# Patient Record
Sex: Male | Born: 1975 | Race: Black or African American | Hispanic: No | Marital: Single | Smoking: Current every day smoker
Health system: Southern US, Community
[De-identification: ages and names within clinical notes are randomized; demographics above are authoritative.]

## PROBLEM LIST (undated history)

## (undated) DIAGNOSIS — J45909 Unspecified asthma, uncomplicated: Secondary | ICD-10-CM

## (undated) DIAGNOSIS — T7840XA Allergy, unspecified, initial encounter: Secondary | ICD-10-CM

## (undated) HISTORY — DX: Allergy, unspecified, initial encounter: T78.40XA

## (undated) HISTORY — PX: SHOULDER SURGERY: SHX246

## (undated) HISTORY — PX: KNEE SURGERY: SHX244

---

## 1998-08-18 ENCOUNTER — Emergency Department (HOSPITAL_COMMUNITY): Admission: EM | Admit: 1998-08-18 | Discharge: 1998-08-18 | Payer: Self-pay | Admitting: Emergency Medicine

## 1998-08-18 ENCOUNTER — Encounter: Payer: Self-pay | Admitting: Emergency Medicine

## 1999-11-14 ENCOUNTER — Encounter: Payer: Self-pay | Admitting: Emergency Medicine

## 1999-11-14 ENCOUNTER — Emergency Department (HOSPITAL_COMMUNITY): Admission: EM | Admit: 1999-11-14 | Discharge: 1999-11-14 | Payer: Self-pay | Admitting: Emergency Medicine

## 2005-12-04 ENCOUNTER — Emergency Department (HOSPITAL_COMMUNITY): Admission: EM | Admit: 2005-12-04 | Discharge: 2005-12-04 | Payer: Self-pay | Admitting: Emergency Medicine

## 2008-04-12 ENCOUNTER — Inpatient Hospital Stay (HOSPITAL_COMMUNITY): Admission: EM | Admit: 2008-04-12 | Discharge: 2008-04-13 | Payer: Self-pay | Admitting: Emergency Medicine

## 2010-05-26 LAB — LIPID PANEL
Cholesterol: 147 mg/dL (ref 0–200)
HDL: 39 mg/dL — ABNORMAL LOW (ref >39)
LDL Cholesterol: 91 mg/dL (ref 0–99)
Total CHOL/HDL Ratio: 3.8 RATIO
Triglycerides: 83 mg/dL (ref <150)
VLDL: 17 mg/dL (ref 0–40)

## 2010-05-26 LAB — CARDIAC PANEL(CRET KIN+CKTOT+MB+TROPI)
CK, MB: 2 ng/mL (ref 0.3–4.0)
CK, MB: 2.3 ng/mL (ref 0.3–4.0)
Relative Index: 0.6 (ref 0.0–2.5)
Relative Index: 0.6 (ref 0.0–2.5)
Total CK: 360 U/L — ABNORMAL HIGH (ref 7–232)
Total CK: 408 U/L — ABNORMAL HIGH (ref 7–232)
Troponin I: <0.01 ng/mL (ref 0.00–0.06)

## 2010-05-26 LAB — CBC
HCT: 38.2 % — ABNORMAL LOW (ref 39.0–52.0)
Hemoglobin: 11.8 g/dL — ABNORMAL LOW (ref 13.0–17.0)
RBC: 5.03 MIL/uL (ref 4.22–5.81)
RDW: 15.6 % — ABNORMAL HIGH (ref 11.5–15.5)

## 2010-05-31 LAB — CBC
MCHC: 31.1 g/dL (ref 30.0–36.0)
MCV: 75.3 fL — ABNORMAL LOW (ref 78.0–100.0)
Platelets: 269 10*3/uL (ref 150–400)
RDW: 15.6 % — ABNORMAL HIGH (ref 11.5–15.5)

## 2010-05-31 LAB — BASIC METABOLIC PANEL
BUN: 7 mg/dL (ref 6–23)
CO2: 27 mEq/L (ref 19–32)
Calcium: 9.1 mg/dL (ref 8.4–10.5)
Chloride: 103 mEq/L (ref 96–112)
Creatinine, Ser: 1.19 mg/dL (ref 0.4–1.5)
GFR calc Af Amer: >60 mL/min (ref >60)
Glucose, Bld: 101 mg/dL — ABNORMAL HIGH (ref 70–99)

## 2010-05-31 LAB — CARDIAC PANEL(CRET KIN+CKTOT+MB+TROPI)
CK, MB: 2.8 ng/mL (ref 0.3–4.0)
Total CK: 474 U/L — ABNORMAL HIGH (ref 7–232)
Troponin I: 0.01 ng/mL (ref 0.00–0.06)

## 2010-05-31 LAB — PROTIME-INR
INR: 1.2 (ref 0.00–1.49)
Prothrombin Time: 15.4 seconds — ABNORMAL HIGH (ref 11.6–15.2)

## 2010-05-31 LAB — POCT CARDIAC MARKERS: Myoglobin, poc: 135 ng/mL (ref 12–200)

## 2010-05-31 LAB — TSH: TSH: 0.891 u[IU]/mL (ref 0.350–4.500)

## 2010-05-31 LAB — HEPARIN LEVEL (UNFRACTIONATED): Heparin Unfractionated: 0.51 IU/mL (ref 0.30–0.70)

## 2010-07-01 NOTE — Discharge Summary (Signed)
Tanner Holden, Tanner Holden          ACCOUNT NO.:  [id number]   MEDICAL RECORD NO.:  [id number]          PATIENT TYPE:  INP   LOCATION:  1431                         FACILITY:  [facility]   PHYSICIAN:  Ricki Rodriguez, M.D.  DATE OF BIRTH:  01-21-1976   DATE OF ADMISSION:  04/12/2008  DATE OF DISCHARGE:  04/13/2008                               DISCHARGE SUMMARY   FINAL DIAGNOSES:  1. Precordial chest pain.  2. Tobacco use disorder.  3. Asthma.  4. Tachycardia.   PRINCIPAL PROCEDURES:  The patient had left heart catheterization,  selective coronary angiography, left renal function study done by Dr.  Orpah Cobb on April 13, 2008.   DISCHARGE MEDICATIONS:  1. Prilosec 20 mg 1 daily.  2. Metoprolol 50 mg 1/2 twice daily.   DISCHARGE DIET:  Low-sodium, heart-healthy diet.   WOUND CARE INSTRUCTIONS:  The patient to notify right groin pain,  swelling or discharge.   FOLLOWUP:  By Dr. Orpah Cobb in 1 week.  The patient to call [phone]  for appointment.   DISCHARGE ACTIVITY:  The patient to increase activity slowly after 2  days of sedentary lifestyle.   HISTORY:  This 35 year old black male presented with knot type of lower  sternal chest pain increased with deep breathing with negative risk  factors for diabetes and hypertension.  Positive history of smoking and  alcohol intake and negative history of exercise.   PHYSICAL EXAMINATION:  Temperature 98.8, pulse 88, respirations 16,  blood pressure of 117/69.  GENERAL:  The patient is a well-built, well-nourished black male in no  distress.  HEENT:  The patient is normocephalic, atraumatic.  Has brown eyes with  pupil equally reacting to light.  Extraocular movement intact.  NECK:  No JVD.  LUNGS:  Mild expiratory wheeze, chest wall nontender on palpation.  HEART:  Normal S1, S2.  ABDOMEN:  Soft and nontender.  EXTREMITIES:  No edema, cyanosis, clubbing.  SKIN:  Warm and dry.  NEUROLOGICAL:  Cranial nerves grossly intact.  The  patient moves all 4  extremities.   LABORATORY DATA:  Normal hemoglobin, hematocrit, WBC count, platelet  count, normal electrolytes, BUN, creatinine.   EKG:  Sinus rhythm with anterolateral wall ischemia and left lower  hypertrophy.   Chest x-ray:  No acute process.  CK-MB, troponin I negative for  myocardial infarction.   Cardiac catheterization showed normal coronaries and normal LV systolic  function.   HOSPITAL COURSE:  The patient was admitted to telemetry unit.  Myocardial infarction was ruled out.  He underwent cardiac  catheterization that failed to show coronary artery disease.  Hence, he  was discharged home on metoprolol low dose and Prilosec 20 mg 1 daily  with follow-up by me in 2-4 weeks.      Ricki Rodriguez, M.D.  Electronically Signed     ASK/MEDQ  D:  05/14/2008  T:  05/14/2008  Job:  [id number]

## 2010-07-19 ENCOUNTER — Emergency Department (HOSPITAL_COMMUNITY)
Admission: EM | Admit: 2010-07-19 | Discharge: 2010-07-19 | Disposition: A | Discharge status: Home / Self Care | Payer: Self-pay | Attending: Emergency Medicine | Admitting: Emergency Medicine

## 2010-07-19 DIAGNOSIS — J45909 Unspecified asthma, uncomplicated: Secondary | ICD-10-CM | POA: Insufficient documentation

## 2010-07-19 DIAGNOSIS — R109 Unspecified abdominal pain: Secondary | ICD-10-CM | POA: Insufficient documentation

## 2010-07-19 DIAGNOSIS — R197 Diarrhea, unspecified: Secondary | ICD-10-CM | POA: Insufficient documentation

## 2010-07-19 LAB — DIFFERENTIAL
Basophils Absolute: 0 10*3/uL (ref 0.0–0.1)
Lymphs Abs: 1.8 10*3/uL (ref 0.7–4.0)
Monocytes Absolute: 0.6 10*3/uL (ref 0.1–1.0)
Monocytes Relative: 9 % (ref 3–12)
Neutro Abs: 4.4 10*3/uL (ref 1.7–7.7)

## 2010-07-19 LAB — LIPASE, BLOOD: Lipase: 19 U/L (ref 11–59)

## 2010-07-19 LAB — COMPREHENSIVE METABOLIC PANEL
Alkaline Phosphatase: 80 U/L (ref 39–117)
BUN: 14 mg/dL (ref 6–23)
Chloride: 97 mEq/L (ref 96–112)
Creatinine, Ser: 1.3 mg/dL (ref 0.4–1.5)
GFR calc non Af Amer: >60 mL/min (ref >60)
Glucose, Bld: 94 mg/dL (ref 70–99)
Potassium: 4.2 mEq/L (ref 3.5–5.1)
Total Bilirubin: 0.3 mg/dL (ref 0.3–1.2)

## 2010-07-19 LAB — URINALYSIS, ROUTINE W REFLEX MICROSCOPIC
Ketones, ur: NEGATIVE mg/dL
Nitrite: NEGATIVE
Protein, ur: NEGATIVE mg/dL
Urobilinogen, UA: 0.2 mg/dL (ref 0.0–1.0)

## 2010-07-19 LAB — CBC
HCT: 41.1 % (ref 39.0–52.0)
MCH: 23.3 pg — ABNORMAL LOW (ref 26.0–34.0)
MCV: 71.6 fL — ABNORMAL LOW (ref 78.0–100.0)
Platelets: 272 10*3/uL (ref 150–400)
RBC: 5.74 MIL/uL (ref 4.22–5.81)
WBC: 7.1 10*3/uL (ref 4.0–10.5)

## 2012-01-16 ENCOUNTER — Other Ambulatory Visit: Payer: Self-pay | Admitting: Family Medicine

## 2012-01-16 DIAGNOSIS — M25562 Pain in left knee: Secondary | ICD-10-CM

## 2012-01-20 ENCOUNTER — Ambulatory Visit
Admission: RE | Admit: 2012-01-20 | Discharge: 2012-01-20 | Disposition: A | Discharge status: Home / Self Care | Payer: BC Managed Care – PPO | Source: Ambulatory Visit | Attending: Family Medicine | Admitting: Family Medicine

## 2012-01-20 DIAGNOSIS — M25562 Pain in left knee: Secondary | ICD-10-CM

## 2012-06-12 ENCOUNTER — Emergency Department (HOSPITAL_COMMUNITY): Payer: Managed Care, Other (non HMO)

## 2012-06-12 ENCOUNTER — Emergency Department (HOSPITAL_COMMUNITY)
Admission: EM | Admit: 2012-06-12 | Discharge: 2012-06-13 | Disposition: A | Discharge status: Home / Self Care | Payer: Managed Care, Other (non HMO) | Attending: Emergency Medicine | Admitting: Emergency Medicine

## 2012-06-12 ENCOUNTER — Encounter (HOSPITAL_COMMUNITY): Payer: Self-pay | Admitting: *Deleted

## 2012-06-12 DIAGNOSIS — R61 Generalized hyperhidrosis: Secondary | ICD-10-CM | POA: Insufficient documentation

## 2012-06-12 DIAGNOSIS — J159 Unspecified bacterial pneumonia: Secondary | ICD-10-CM | POA: Insufficient documentation

## 2012-06-12 DIAGNOSIS — R222 Localized swelling, mass and lump, trunk: Secondary | ICD-10-CM | POA: Insufficient documentation

## 2012-06-12 DIAGNOSIS — F17200 Nicotine dependence, unspecified, uncomplicated: Secondary | ICD-10-CM | POA: Insufficient documentation

## 2012-06-12 DIAGNOSIS — J189 Pneumonia, unspecified organism: Secondary | ICD-10-CM

## 2012-06-12 DIAGNOSIS — J45909 Unspecified asthma, uncomplicated: Secondary | ICD-10-CM | POA: Insufficient documentation

## 2012-06-12 DIAGNOSIS — R918 Other nonspecific abnormal finding of lung field: Secondary | ICD-10-CM

## 2012-06-12 DIAGNOSIS — Z880 Allergy status to penicillin: Secondary | ICD-10-CM | POA: Insufficient documentation

## 2012-06-12 HISTORY — DX: Unspecified asthma, uncomplicated: J45.909

## 2012-06-12 LAB — CBC WITH DIFFERENTIAL/PLATELET
Basophils Absolute: 0.1 10*3/uL (ref 0.0–0.1)
Eosinophils Absolute: 0.3 10*3/uL (ref 0.0–0.7)
HCT: 37 % — ABNORMAL LOW (ref 39.0–52.0)
Lymphocytes Relative: 22 % (ref 12–46)
MCHC: 32.4 g/dL (ref 30.0–36.0)
Neutro Abs: 4.1 10*3/uL (ref 1.7–7.7)
Neutrophils Relative %: 61 % (ref 43–77)
Platelets: 283 10*3/uL (ref 150–400)
RDW: 14 % (ref 11.5–15.5)
WBC: 6.8 10*3/uL (ref 4.0–10.5)

## 2012-06-12 LAB — COMPREHENSIVE METABOLIC PANEL
AST: 38 U/L — ABNORMAL HIGH (ref 0–37)
Albumin: 3.6 g/dL (ref 3.5–5.2)
Alkaline Phosphatase: 69 U/L (ref 39–117)
BUN: 12 mg/dL (ref 6–23)
Potassium: 4.5 mEq/L (ref 3.5–5.1)
Total Protein: 7.4 g/dL (ref 6.0–8.3)

## 2012-06-12 MED ORDER — LEVOFLOXACIN 500 MG PO TABS: 500.0000 mg | ORAL_TABLET | Freq: Every day | ORAL | Status: DC

## 2012-06-12 MED ORDER — HYDROCODONE-ACETAMINOPHEN 5-325 MG PO TABS: 1.0000 | ORAL_TABLET | ORAL | Status: DC | PRN

## 2012-06-12 MED ORDER — IOHEXOL 350 MG/ML SOLN
100.0000 mL | Freq: Once | INTRAVENOUS | Status: AC | PRN
  Administered 2012-06-12: 100 mL via INTRAVENOUS

## 2012-06-12 MED ORDER — KETOROLAC TROMETHAMINE 30 MG/ML IJ SOLN
30.0000 mg | Freq: Once | INTRAMUSCULAR | Status: AC
  Administered 2012-06-12: 30 mg via INTRAVENOUS
  Filled 2012-06-12: qty 1

## 2012-06-12 NOTE — ED Provider Notes (Signed)
History     CSN: [id number]  Arrival date & time 06/12/12  1826   First MD Initiated Contact with Patient 06/12/12 2020      Chief Complaint  Patient presents with  . Chest Pain    (Consider location/radiation/quality/duration/timing/severity/associated sxs/prior treatment) HPI Pt with episodic L lower chest pain x 1 week which has become constant since Friday. Pain is worse with deep breathing. No recent travel or surgeries. No cough, fever. +sweats.  Pt seen in [place] [facility] and diagnosed with pneumonia. Start on z-pak. Pain is persistent. Cath 2010 with normal coronary arteries.  Past Medical History  Diagnosis Date  . Asthma     Past Surgical History  Procedure Laterality Date  . Shoulder surgery    . Knee surgery      History reviewed. No pertinent family history.  History  Substance Use Topics  . Smoking status: Current Every Day Smoker    Types: Cigarettes  . Smokeless tobacco: Not on file  . Alcohol Use: Yes      Review of Systems  Constitutional: Positive for diaphoresis. Negative for fever.  HENT: Negative for neck pain and neck stiffness.   Respiratory: Negative for cough and shortness of breath.   Cardiovascular: Positive for chest pain. Negative for palpitations and leg swelling.  Gastrointestinal: Negative for nausea, vomiting, abdominal pain and diarrhea.  Musculoskeletal: Negative for myalgias and back pain.  Skin: Negative for pallor, rash and wound.  Neurological: Negative for dizziness, weakness, light-headedness, numbness and headaches.  All other systems reviewed and are negative.    Allergies  Penicillins  Home Medications   Current Outpatient Rx  Name  Route  Sig  Dispense  Refill  . HYDROcodone-acetaminophen (NORCO) 10-325 MG per tablet   Oral   Take 1 tablet by mouth every 6 (six) hours as needed for pain.         Marland Kitchen HYDROcodone-acetaminophen (NORCO) 5-325 MG per tablet   Oral   Take 1 tablet by mouth every 4 (four) hours as  needed for pain.   20 tablet   0   . levofloxacin (LEVAQUIN) 500 MG tablet   Oral   Take 1 tablet (500 mg total) by mouth daily.   7 tablet   0     BP 128/85  Pulse 104  Temp(Src) 98.9 F (37.2 C) (Oral)  Resp 18  SpO2 97%  Physical Exam  Nursing note and vitals reviewed. Constitutional: He is oriented to person, place, and time. He appears well-developed and well-nourished. No distress.  HENT:  Head: Normocephalic and atraumatic.  Mouth/Throat: Oropharynx is clear and moist.  Eyes: EOM are normal. Pupils are equal, round, and reactive to light.  Neck: Normal range of motion. Neck supple.  Cardiovascular: Normal rate and regular rhythm.  Exam reveals no friction rub.   No murmur heard. Pulmonary/Chest: Effort normal and breath sounds normal. No respiratory distress. He has no wheezes. He has no rales. He exhibits no tenderness.  Abdominal: Soft. Bowel sounds are normal. He exhibits no distension and no mass. There is no tenderness. There is no rebound and no guarding.  Musculoskeletal: Normal range of motion. He exhibits no edema and no tenderness.  No calf tenderness or swelling  Neurological: He is alert and oriented to person, place, and time.  Skin: Skin is warm and dry. No rash noted. No erythema.  Psychiatric: He has a normal mood and affect. His behavior is normal.    ED Course  Procedures (including critical care time)  Labs Reviewed  CBC WITH DIFFERENTIAL - Abnormal; Notable for the following:    Hemoglobin 12.0 (*)    HCT 37.0 (*)    MCV 71.6 (*)    MCH 23.2 (*)    All other components within normal limits  COMPREHENSIVE METABOLIC PANEL - Abnormal; Notable for the following:    Sodium 133 (*)    Glucose, Bld 107 (*)    AST 38 (*)    GFR calc non Af Amer 87 (*)    All other components within normal limits  POCT I-STAT TROPONIN I   Dg Chest 2 View  06/12/2012  *RADIOLOGY REPORT*  Clinical Data: Cough and wheezing.  Chest pain.  CHEST - 2 VIEW   Comparison: 06/08/2012  Findings: Two-view chest shows no edema.  Right lung is clear.  The airspace disease at the left base seen previously has improved but not completely resolved. Imaged bony structures of the thorax are intact. Telemetry leads overlie the chest.  IMPRESSION: Interval improvement without resolution of airspace disease involving the left base.   Original Report Authenticated By: Kennith Center, M.D.    Ct Angio Chest Pe W/cm &/or Wo Cm  06/12/2012  *RADIOLOGY REPORT*  Clinical Data: Pulmonary embolism.  Chest pain.  CT ANGIOGRAPHY CHEST  Technique:  Multidetector CT imaging of the chest using the standard protocol during bolus administration of intravenous contrast. Multiplanar reconstructed images including MIPs were obtained and reviewed to evaluate the vascular anatomy.  Contrast: OMNIPAQUE IOHEXOL 350 MG/ML SOLN  Comparison: 06/12/2012.  Findings: Technically adequate study without pulmonary embolism. The heart appears normal.  No mediastinal or hilar adenopathy.  No pericardial effusion. Tiny dependently layering left pleural effusion.  There is a mass in the left lower lobe abutting the posterior pleural surface measuring 32 mm (image number 58 series 5). There are no aggressive osseous lesions identified.  IMPRESSION: 1.  Negative for pulmonary embolus or acute aortic abnormality. 2.  Left lower lobe mass measuring 32 mm.  This may represent bronchogenic neoplasm or infection/inflammatory lesion. 3.  Tiny dependently layering left pleural effusion may represent parapneumonic effusion associated with pneumonia.  Malignant effusion considered less likely but difficult to exclude.   Original Report Authenticated By: Andreas Newport, M.D.      1. Community acquired pneumonia   2. Lung mass      Date: 06/12/2012  Rate: 94  Rhythm: normal sinus rhythm  QRS Axis: normal  Intervals: normal  ST/T Wave abnormalities: nonspecific T wave changes  Conduction Disutrbances:none   Narrative Interpretation:   Old EKG Reviewed: unchanged T wave inversions in inferior and lateral leads unchanged since EKG 04/13/08   MDM   Pt advised to f/u with PMD for ? Lung mass vs infection. No evidence of CAD. Counseled to stop smoking.        Loren Racer, MD 06/12/12 [phone]

## 2012-06-12 NOTE — ED Notes (Addendum)
Pt reports he was seen recently at [facility] and treated for pneumonia. Reports chest pain has gotten better but chest still hurts and back hurts. Reports he has finished course of antibiotics and is not feeling better. Pt reports pain is worse when lying and better when sitting forward.

## 2013-10-30 ENCOUNTER — Ambulatory Visit (INDEPENDENT_AMBULATORY_CARE_PROVIDER_SITE_OTHER): Payer: BC Managed Care – PPO

## 2013-10-30 ENCOUNTER — Ambulatory Visit (INDEPENDENT_AMBULATORY_CARE_PROVIDER_SITE_OTHER): Payer: BC Managed Care – PPO | Admitting: Family Medicine

## 2013-10-30 VITALS — BP 122/84 | HR 83 | Temp 97.8°F | Resp 16 | Ht 68.0 in | Wt 197.0 lb

## 2013-10-30 DIAGNOSIS — R599 Enlarged lymph nodes, unspecified: Secondary | ICD-10-CM

## 2013-10-30 DIAGNOSIS — R05 Cough: Secondary | ICD-10-CM

## 2013-10-30 DIAGNOSIS — Z0000 Encounter for general adult medical examination without abnormal findings: Secondary | ICD-10-CM

## 2013-10-30 DIAGNOSIS — R590 Localized enlarged lymph nodes: Secondary | ICD-10-CM

## 2013-10-30 DIAGNOSIS — R059 Cough, unspecified: Secondary | ICD-10-CM

## 2013-10-30 DIAGNOSIS — J45909 Unspecified asthma, uncomplicated: Secondary | ICD-10-CM

## 2013-10-30 DIAGNOSIS — Z13220 Encounter for screening for lipoid disorders: Secondary | ICD-10-CM

## 2013-10-30 DIAGNOSIS — R062 Wheezing: Secondary | ICD-10-CM

## 2013-10-30 DIAGNOSIS — J4520 Mild intermittent asthma, uncomplicated: Secondary | ICD-10-CM

## 2013-10-30 DIAGNOSIS — Z131 Encounter for screening for diabetes mellitus: Secondary | ICD-10-CM

## 2013-10-30 LAB — POCT CBC
GRANULOCYTE PERCENT: 64.8 % (ref 37–80)
HEMATOCRIT: 43.9 % (ref 43.5–53.7)
HEMOGLOBIN: 13.3 g/dL — AB (ref 14.1–18.1)
LYMPH, POC: 1.8 (ref 0.6–3.4)
MCH, POC: 23.1 pg — AB (ref 27–31.2)
MCHC: 30.4 g/dL — AB (ref 31.8–35.4)
MCV: 76 fL — AB (ref 80–97)
MID (cbc): 0.1 (ref 0–0.9)
MPV: 8.6 fL (ref 0–99.8)
POC GRANULOCYTE: 3.4 (ref 2–6.9)
POC LYMPH %: 33.9 % (ref 10–50)
POC MID %: 1.3 %M (ref 0–12)
Platelet Count, POC: 261 10*3/uL (ref 142–424)
RBC: 5.78 M/uL (ref 4.69–6.13)
RDW, POC: 16.2 %
WBC: 5.3 10*3/uL (ref 4.6–10.2)

## 2013-10-30 LAB — LIPID PANEL
CHOL/HDL RATIO: 4.2 ratio
CHOLESTEROL: 214 mg/dL — AB (ref 0–200)
HDL: 51 mg/dL (ref >39)
LDL CALC: 142 mg/dL — AB (ref 0–99)
TRIGLYCERIDES: 105 mg/dL (ref <150)
VLDL: 21 mg/dL (ref 0–40)

## 2013-10-30 LAB — POCT GLYCOSYLATED HEMOGLOBIN (HGB A1C): Hemoglobin A1C: 5.2

## 2013-10-30 MED ORDER — FLUTICASONE PROPIONATE HFA 44 MCG/ACT IN AERO: 2.0000 | INHALATION_SPRAY | Freq: Two times a day (BID) | RESPIRATORY_TRACT | Status: AC

## 2013-10-30 NOTE — Patient Instructions (Addendum)
Smoking Cessation Quitting smoking is important to your health and has many advantages. However, it is not always easy to quit since nicotine is a very addictive drug. Oftentimes, people try 3 times or more before being able to quit. This document explains the best ways for you to prepare to quit smoking. Quitting takes hard work and a lot of effort, but you can do it. ADVANTAGES OF QUITTING SMOKING  You will live longer, feel better, and live better.  Your body will feel the impact of quitting smoking almost immediately.  Within 20 minutes, blood pressure decreases. Your pulse returns to its normal level.  After 8 hours, carbon monoxide levels in the blood return to normal. Your oxygen level increases.  After 24 hours, the chance of having a heart attack starts to decrease. Your breath, hair, and body stop smelling like smoke.  After 48 hours, damaged nerve endings begin to recover. Your sense of taste and smell improve.  After 72 hours, the body is virtually free of nicotine. Your bronchial tubes relax and breathing becomes easier.  After 2 to 12 weeks, lungs can hold more air. Exercise becomes easier and circulation improves.  The risk of having a heart attack, stroke, cancer, or lung disease is greatly reduced.  After 1 year, the risk of coronary heart disease is cut in half.  After 5 years, the risk of stroke falls to the same as a nonsmoker.  After 10 years, the risk of lung cancer is cut in half and the risk of other cancers decreases significantly.  After 15 years, the risk of coronary heart disease drops, usually to the level of a nonsmoker.  If you are pregnant, quitting smoking will improve your chances of having a healthy baby.  The people you live with, especially any children, will be healthier.  You will have extra money to spend on things other than cigarettes. QUESTIONS TO THINK ABOUT BEFORE ATTEMPTING TO QUIT You may want to talk about your answers with your  health care provider.  Why do you want to quit?  If you tried to quit in the past, what helped and what did not?  What will be the most difficult situations for you after you quit? How will you plan to handle them?  Who can help you through the tough times? Your family? Friends? A health care provider?  What pleasures do you get from smoking? What ways can you still get pleasure if you quit? Here are some questions to ask your health care provider:  How can you help me to be successful at quitting?  What medicine do you think would be best for me and how should I take it?  What should I do if I need more help?  What is smoking withdrawal like? How can I get information on withdrawal? GET READY  Set a quit date.  Change your environment by getting rid of all cigarettes, ashtrays, matches, and lighters in your home, car, or work. Do not let people smoke in your home.  Review your past attempts to quit. Think about what worked and what did not. GET SUPPORT AND ENCOURAGEMENT You have a better chance of being successful if you have help. You can get support in many ways.  Tell your family, friends, and coworkers that you are going to quit and need their support. Ask them not to smoke around you.  Get individual, group, or telephone counseling and support. Programs are available at local hospitals and health centers. Call   coworkers that you are going to quit and need their support. Ask them not to smoke around you.   Get individual, group, or telephone counseling and support. Programs are available at local hospitals and health centers. Call your local health department for information about programs in your area.   Spiritual beliefs and practices may help some smokers quit.   Download a "quit meter" on your computer to keep track of quit statistics, such as how long you have gone without smoking, cigarettes not smoked, and money saved.   Get a self-help book about quitting smoking and staying off tobacco.  LEARN NEW SKILLS AND BEHAVIORS   Distract yourself from urges to smoke. Talk to someone, go for a walk, or occupy your time with a task.   Change your normal routine. Take a different route to work.  Drink tea instead of coffee. Eat breakfast in a different place.   Reduce your stress. Take a hot bath, exercise, or read a book.   Plan something enjoyable to do every day. Reward yourself for not smoking.   Explore interactive web-based programs that specialize in helping you quit.  GET MEDICINE AND USE IT CORRECTLY  Medicines can help you stop smoking and decrease the urge to smoke. Combining medicine with the above behavioral methods and support can greatly increase your chances of successfully quitting smoking.   Nicotine replacement therapy helps deliver nicotine to your body without the negative effects and risks of smoking. Nicotine replacement therapy includes nicotine gum, lozenges, inhalers, nasal sprays, and skin patches. Some may be available over-the-counter and others require a prescription.   Antidepressant medicine helps people abstain from smoking, but how this works is unknown. This medicine is available by prescription.   Nicotinic receptor partial agonist medicine simulates the effect of nicotine in your brain. This medicine is available by prescription.  Ask your health care provider for advice about which medicines to use and how to use them based on your health history. Your health care provider will tell you what side effects to look out for if you choose to be on a medicine or therapy. Carefully read the information on the package. Do not use any other product containing nicotine while using a nicotine replacement product.   RELAPSE OR DIFFICULT SITUATIONS  Most relapses occur within the first 3 months after quitting. Do not be discouraged if you start smoking again. Remember, most people try several times before finally quitting. You may have symptoms of withdrawal because your body is used to nicotine. You may crave cigarettes, be irritable, feel very hungry, cough often, get headaches, or have difficulty concentrating. The withdrawal symptoms are only temporary. They are strongest  when you first quit, but they will go away within 10-14 days.  To reduce the chances of relapse, try to:   Avoid drinking alcohol. Drinking lowers your chances of successfully quitting.   Reduce the amount of caffeine you consume. Once you quit smoking, the amount of caffeine in your body increases and can give you symptoms, such as a rapid heartbeat, sweating, and anxiety.   Avoid smokers because they can make you want to smoke.   Do not let weight gain distract you. Many smokers will gain weight when they quit, usually less than 10 pounds. Eat a healthy diet and stay active. You can always lose the weight gained after you quit.   Find ways to improve your mood other than smoking.  FOR MORE INFORMATION   www.smokefree.gov   Document Released:   01/24/2001 Document Revised: 06/16/2013 Document Reviewed: 05/11/2011  [employer] Patient Information 2015 [employer]. This information is not intended to replace advice given to you by your health care provider. Make sure you discuss any questions you have with your health care provider.  Asthma  Asthma is a recurring condition in which the airways tighten and narrow. Asthma can make it difficult to breathe. It can cause coughing, wheezing, and shortness of breath. Asthma episodes, also called asthma attacks, range from minor to life-threatening. Asthma cannot be cured, but medicines and lifestyle changes can help control it.  CAUSES  Asthma is believed to be caused by inherited (genetic) and environmental factors, but its exact cause is unknown. Asthma may be triggered by allergens, lung infections, or irritants in the air. Asthma triggers are different for each person. Common triggers include:    Animal dander.   Dust mites.   Cockroaches.   Pollen from trees or grass.   Mold.   Smoke.   Air pollutants such as dust, household cleaners, hair sprays, aerosol sprays, paint fumes, strong chemicals, or strong odors.   Cold air, weather changes, and winds (which  increase molds and pollens in the air).   Strong emotional expressions such as crying or laughing hard.   Stress.   Certain medicines (such as aspirin) or types of drugs (such as beta-blockers).   Sulfites in foods and drinks. Foods and drinks that may contain sulfites include dried fruit, potato chips, and sparkling grape juice.   Infections or inflammatory conditions such as the flu, a cold, or an inflammation of the nasal membranes (rhinitis).   Gastroesophageal reflux disease (GERD).   Exercise or strenuous activity.  SYMPTOMS  Symptoms may occur immediately after asthma is triggered or many hours later. Symptoms include:   Wheezing.   Excessive nighttime or early morning coughing.   Frequent or severe coughing with a common cold.   Chest tightness.   Shortness of breath.  DIAGNOSIS   The diagnosis of asthma is made by a review of your medical history and a physical exam. Tests may also be performed. These may include:   Lung function studies. These tests show how much air you breathe in and out.   Allergy tests.   Imaging tests such as X-rays.  TREATMENT   Asthma cannot be cured, but it can usually be controlled. Treatment involves identifying and avoiding your asthma triggers. It also involves medicines. There are 2 classes of medicine used for asthma treatment:    Controller medicines. These prevent asthma symptoms from occurring. They are usually taken every day.   Reliever or rescue medicines. These quickly relieve asthma symptoms. They are used as needed and provide short-term relief.  Your health care provider will help you create an asthma action plan. An asthma action plan is a written plan for managing and treating your asthma attacks. It includes a list of your asthma triggers and how they may be avoided. It also includes information on when medicines should be taken and when their dosage should be changed. An action plan may also involve the use of a device called a peak flow meter.  A peak flow meter measures how well the lungs are working. It helps you monitor your condition.  HOME CARE INSTRUCTIONS    Take medicines only as directed by your health care provider. Speak with your health care provider if you have questions about how or when to take the medicines.   Use a peak flow meter as directed   Use unscented cleaning products.  Try to have someone else vacuum for you regularly. Stay out of rooms while they are being vacuumed and for a short while afterward. If you vacuum, use a dust mask from a hardware store, a double-layered or microfilter vacuum cleaner bag, or a vacuum cleaner with a HEPA filter.  Replace carpet with wood, tile, or vinyl flooring. Carpet can trap dander and dust.  Use allergy-proof pillows, mattress covers, and box spring covers.  Wash bed sheets and blankets every week in hot water and dry them in a dryer.  Use blankets that are made of polyester or cotton.  Clean bathrooms and kitchens with bleach. If possible, have someone repaint the walls in these rooms with mold-resistant paint. Keep out of the rooms that are being cleaned and painted.  Wash hands frequently. SEEK MEDICAL CARE IF:   You have wheezing, shortness of breath, or a cough even if taking medicine to prevent attacks.  The colored mucus you cough up (sputum) is thicker than usual.  Your sputum changes  from clear or white to yellow, green, gray, or bloody.  You have any problems that may be related to the medicines you are taking (such as a rash, itching, swelling, or trouble breathing).  You are using a reliever medicine more than 2-3 times per week.  Your peak flow is still at 50-79% of your personal best after following your action plan for 1 hour.  You have a fever. SEEK IMMEDIATE MEDICAL CARE IF:   You seem to be getting worse and are unresponsive to treatment during an asthma attack.  You are short of breath even at rest.  You get short of breath when doing very little physical activity.  You have difficulty eating, drinking, or talking due to asthma symptoms.  You develop chest pain.  You develop a fast heartbeat.  You have a bluish color to your lips or fingernails.  You are light-headed, dizzy, or faint.  Your peak flow is less than 50% of your personal best. MAKE SURE YOU:   Understand these instructions.  Will watch your condition.  Will get help right away if you are not doing well or get worse. Document Released: 01/30/2005 Document Revised: 06/16/2013 Document Reviewed: 08/29/2012 [facility] Patient Information 2015 [place], [place]. This information is not intended to replace advice given to you by your health care provider. Make sure you discuss any questions you have with your health care provider.

## 2013-10-30 NOTE — Progress Notes (Signed)
Subjective:    Patient ID: Tanner Holden, male    DOB: 04/28/1975, 38 y.o.   MRN: [id number]  HPI This is a very pleasant 1 male who presents today for wellness paperwork to be completed for his employer. He sees a PCP at [facility]. Can't remember his name.  Patient woke up 5 days ago with chest tightness, pain with inspiration, right sided back pain. Called his PCP and was given a prescription for Levaquin. He is feeling 60% better. Has had asthma all his life. Uses his rescue inhaler 2-3 x a week. Was on inhaled steroid in the past. Smokes 1/2 ppd x 10 years. Has not smoked in 6 days. Exercises several days a week, is able to tolerate well.   Dentist- has not been in a long time Past Medical History  Diagnosis Date  . Asthma   . Allergy    Past Surgical History  Procedure Laterality Date  . Shoulder surgery    . Knee surgery     Family History  Problem Relation Age of Onset  . Cancer Mother   . High Cholesterol Father   . High Cholesterol Brother    History  Substance Use Topics  . Smoking status: Current Every Day Smoker    Types: Cigarettes  . Smokeless tobacco: Not on file  . Alcohol Use: Yes   Review of Systems No fever, some wheezing, cough improved, productive of clear/white sputum.     Objective:   Physical Exam  Vitals reviewed. Constitutional: He is oriented to person, place, and time. He appears well-developed and well-nourished.  HENT:  Head: Normocephalic and atraumatic.  Right Ear: Tympanic membrane, external ear and ear canal normal.  Left Ear: Tympanic membrane, external ear and ear canal normal.  Nose: Nose normal.  Mouth/Throat: Mucous membranes are normal. No oral lesions. Abnormal dentition. No lacerations. No oropharyngeal exudate, posterior oropharyngeal edema, posterior oropharyngeal erythema or tonsillar abscesses.    Eyes: Conjunctivae and EOM are normal. Pupils are equal, round, and reactive to light.  Neck: Normal range of  motion. Neck supple.    Cardiovascular: Normal rate, regular rhythm, normal heart sounds and intact distal pulses.   Pulmonary/Chest: Effort normal. He has wheezes (Right posterior expiratory wheezes. ).  Abdominal: Soft. Bowel sounds are normal. He exhibits no distension and no mass. There is no tenderness. There is no rebound and no guarding. Hernia confirmed negative in the right inguinal area and confirmed negative in the left inguinal area.  Genitourinary: Testes normal and penis normal. No penile tenderness.  Musculoskeletal: Normal range of motion. He exhibits no edema and no tenderness.  Lymphadenopathy:       Right: No inguinal adenopathy present.       Left: No inguinal adenopathy present.  Neurological: He is alert and oriented to person, place, and time. He has normal reflexes. No cranial nerve deficit. Coordination normal.  Skin: Skin is warm and dry.  Psychiatric: He has a normal mood and affect. His behavior is normal. Judgment and thought content normal.   CXR- [facility] reading (PRIMARY) by  Dr. Clelia Croft: No acute abnormalities     Assessment & Plan:  1. Annual physical exam  2. Wheezing - POCT CBC - DG Chest 2 View; Future - continue Levaquin, Mucinex, use albuterol inhaler every 4 hours while awake for next couple of days  3. Cough - POCT CBC - DG Chest 2 View; Future  4. Screening for diabetes mellitus - POCT glycosylated hemoglobin (Hb A1C)  5. Screening for lipid disorders - Lipid panel  6. Asthma, chronic, mild intermittent, uncomplicated - fluticasone (FLOVENT HFA) 44 MCG/ACT inhaler; Inhale 2 puffs into the lungs 2 (two) times daily.  Dispense: 1 Inhaler; Refill: 12 -provided patient with copy of peak flow reading and instructed him to share these results with his PCP -F/u 2 months with PCP for asthma  7. Submental lymphadenopathy -patient reports these are chronic, I have instructed him to seek dental care.   Emi Belfast, FNP-BC  [facility], [facility]  10/30/2013 1:59 PM

## 2013-11-02 NOTE — Progress Notes (Signed)
Reviewed documentation and xray and agree w/ assessment and plan. Blondell Laperle, MD MPH   

## 2014-01-31 ENCOUNTER — Encounter (HOSPITAL_COMMUNITY): Payer: Self-pay

## 2014-01-31 ENCOUNTER — Emergency Department (HOSPITAL_COMMUNITY)
Admission: EM | Admit: 2014-01-31 | Discharge: 2014-01-31 | Disposition: A | Discharge status: Home / Self Care | Payer: BC Managed Care – PPO | Attending: Emergency Medicine | Admitting: Emergency Medicine

## 2014-01-31 DIAGNOSIS — Z79899 Other long term (current) drug therapy: Secondary | ICD-10-CM | POA: Diagnosis not present

## 2014-01-31 DIAGNOSIS — Z720 Tobacco use: Secondary | ICD-10-CM | POA: Insufficient documentation

## 2014-01-31 DIAGNOSIS — L03112 Cellulitis of left axilla: Secondary | ICD-10-CM | POA: Insufficient documentation

## 2014-01-31 DIAGNOSIS — Z7952 Long term (current) use of systemic steroids: Secondary | ICD-10-CM | POA: Diagnosis not present

## 2014-01-31 DIAGNOSIS — L0390 Cellulitis, unspecified: Secondary | ICD-10-CM

## 2014-01-31 DIAGNOSIS — L02412 Cutaneous abscess of left axilla: Secondary | ICD-10-CM | POA: Diagnosis present

## 2014-01-31 DIAGNOSIS — J45909 Unspecified asthma, uncomplicated: Secondary | ICD-10-CM | POA: Insufficient documentation

## 2014-01-31 DIAGNOSIS — Z880 Allergy status to penicillin: Secondary | ICD-10-CM | POA: Diagnosis not present

## 2014-01-31 DIAGNOSIS — L0291 Cutaneous abscess, unspecified: Secondary | ICD-10-CM

## 2014-01-31 MED ORDER — SULFAMETHOXAZOLE-TRIMETHOPRIM 800-160 MG PO TABS: 1.0000 | ORAL_TABLET | Freq: Two times a day (BID) | ORAL | Status: AC

## 2014-01-31 MED ORDER — LIDOCAINE-EPINEPHRINE (PF) 2 %-1:200000 IJ SOLN
10.0000 mL | Freq: Once | INTRAMUSCULAR | Status: AC
  Administered 2014-01-31: 10 mL
  Filled 2014-01-31: qty 20

## 2014-01-31 MED ORDER — HYDROCODONE-ACETAMINOPHEN 5-325 MG PO TABS
2.0000 | ORAL_TABLET | Freq: Once | ORAL | Status: AC
  Administered 2014-01-31: 2 via ORAL
  Filled 2014-01-31: qty 2

## 2014-01-31 MED ORDER — HYDROCODONE-ACETAMINOPHEN 5-325 MG PO TABS: 1.0000 | ORAL_TABLET | ORAL | Status: DC | PRN

## 2014-01-31 NOTE — ED Provider Notes (Signed)
CSN: [id number]     Arrival date & time 01/31/14  1015 History   First MD Initiated Contact with Patient 01/31/14 1033     Chief Complaint  Patient presents with  . Abscess     (Consider location/radiation/quality/duration/timing/severity/associated sxs/prior Treatment) HPI   Pt presents with recurrent abscesses of left axilla for the past 6 weeks.  States he changed deodorant brands and developed an itchy rash, he shaved the hair in his left axilla so that he could put hydrocortisone cream on it.  He then began having "boils"  He has popped one with white thick discharge but otherwise there has been no discharge.  The areas are tender.   Has stopped using deodorant on that side for past 4-5 days.  Denies fevers.  He has no rash or infections anywhere else.  Denies CP, SOB.    Past Medical History  Diagnosis Date  . Asthma   . Allergy    Past Surgical History  Procedure Laterality Date  . Shoulder surgery    . Knee surgery     Family History  Problem Relation Age of Onset  . Cancer Mother   . High Cholesterol Father   . High Cholesterol Brother    History  Substance Use Topics  . Smoking status: Current Every Day Smoker    Types: Cigarettes  . Smokeless tobacco: Not on file  . Alcohol Use: Yes    Review of Systems  All other systems reviewed and are negative.     Allergies  Penicillins  Home Medications   Prior to Admission medications   Medication Sig Start Date End Date Taking? Authorizing Provider  fluticasone (FLOVENT HFA) 44 MCG/ACT inhaler Inhale 2 puffs into the lungs 2 (two) times daily. 10/30/13   Emi Belfasteborah B Gessner, FNP  levofloxacin (LEVAQUIN) 750 MG tablet Take 750 mg by mouth daily. 10/26/13   Historical Provider, MD  montelukast (SINGULAIR) 10 MG tablet Take 10 mg by mouth at bedtime.    Historical Provider, MD   BP 139/83 mmHg  Pulse 89  Temp(Src) 98.4 F (36.9 C) (Oral)  Resp 17  SpO2 100% Physical Exam  Constitutional: He appears  well-developed and well-nourished. No distress.  HENT:  Head: Normocephalic and atraumatic.  Neck: Neck supple.  Pulmonary/Chest: Effort normal.  Neurological: He is alert.  Skin: He is not diaphoretic.  Left axilla with 5 areas of induration with overlying erythema, tenderness.  No active drainage.    Nursing note and vitals reviewed.   ED Course  Procedures (including critical care time) Labs Review Labs Reviewed - No data to display  Imaging Review No results found.   EKG Interpretation None       EMERGENCY DEPARTMENT US SOFT TISSUE INTERPRETATION "Study: Limited Ultrasound of the noted body part in comments below"  INDICATIONS: Soft tissue infection Multiple views of the body part are obtained with a multi-frequency linear probe  PERFORMED BY:  Myself  IMAGES ARCHIVED?: Yes  SIDE:Left  BODY PART:Axilla  FINDINGS: Abcess present, No abcess noted and Cellulitis present, various locations.  LIMITATIONS:  Body Habitus  INTERPRETATION:  Abcess present, No abcess noted and Cellulitis present  COMMENT:  Left axilla with multiple areas examined, some with abscess present, others without.     INCISION AND DRAINAGE Performed by: Trixie DredgeWEST, Emali Heyward Consent: Verbal consent obtained. Risks and benefits: risks, benefits and alternatives were discussed Type: abscess  Body area: left axilla  Anesthesia: local infiltration  Incision was made with a scalpel.  Local anesthetic:  lidocaine 2% with epinephrine  Anesthetic total: 2 ml  Complexity: complex Blunt dissection to break up loculations  Drainage: purulent  Drainage amount: small  Packing material: none  Irrigated with normal saline  Patient tolerance: Patient tolerated the procedure well with no immediate complications.  INCISION AND DRAINAGE Performed by: Trixie DredgeWEST, Aleen Marston Consent: Verbal consent obtained. Risks and benefits: risks, benefits and alternatives were discussed Type: abscess  Body area: left  axilla  Anesthesia: local infiltration  Incision was made with a scalpel.  Local anesthetic: lidocaine 2% with epinephrine  Anesthetic total: 1 ml  Complexity: complex Blunt dissection to break up loculations  Drainage: none Drainage amount: none  Packing material: none  Irrigated with normal saline  Patient tolerance: Patient tolerated the procedure well with no immediate complications.        MDM   Final diagnoses:  Abscess of left axilla  Abscess and cellulitis   Afebrile, nontoxic patient with left axillary abscesses with overlying cellulitis.  I&D in ED.   D/C home with bactrim, norco, instructions for home care including warm moist compresses.   Discussed result, findings, treatment, and follow up  with patient.  Pt given return precautions.  Pt verbalizes understanding and agrees with plan.        Trixie Dredgemily Shirley Decamp, PA-C 01/31/14 1216  Donnetta HutchingBrian Cook, MD 01/31/14 [phone]

## 2014-01-31 NOTE — ED Notes (Signed)
Pt given post procedure education.  Dressing applied.

## 2014-01-31 NOTE — ED Notes (Signed)
Pt used new deodorant.  Pt arm became under arm became red/rashed. Pt then shaved under arm to remove irritation.  Now noticeable under skin abscess to left underarm.

## 2014-01-31 NOTE — Discharge Instructions (Signed)
Read the information below.  Use the prescribed medication as directed.  Please discuss all new medications with your pharmacist.  Do not take additional tylenol while taking the prescribed pain medication to avoid overdose.  You may return to the Emergency Department at any time for worsening condition or any new symptoms that concern you.  If you develop increased redness, swelling, uncontrolled pain, or fevers greater than 100.4, return to the ER immediately for a recheck.     Abscess Care After An abscess (also called a boil or furuncle) is an infected area that contains a collection of pus. Signs and symptoms of an abscess include pain, tenderness, redness, or hardness, or you may feel a moveable soft area under your skin. An abscess can occur anywhere in the body. The infection may spread to surrounding tissues causing cellulitis. A cut (incision) by the surgeon was made over your abscess and the pus was drained out. Gauze may have been packed into the space to provide a drain that will allow the cavity to heal from the inside outwards. The boil may be painful for 5 to 7 days. Most people with a boil do not have high fevers. Your abscess, if seen early, may not have localized, and may not have been lanced. If not, another appointment may be required for this if it does not get better on its own or with medications. HOME CARE INSTRUCTIONS   Only take over-the-counter or prescription medicines for pain, discomfort, or fever as directed by your caregiver.  When you bathe, soak and then remove gauze or iodoform packs at least daily or as directed by your caregiver. You may then wash the wound gently with mild soapy water. Repack with gauze or do as your caregiver directs. SEEK IMMEDIATE MEDICAL CARE IF:   You develop increased pain, swelling, redness, drainage, or bleeding in the wound site.  You develop signs of generalized infection including muscle aches, chills, fever, or a general ill  feeling.  An oral temperature above 102 F (38.9 C) develops, not controlled by medication. See your caregiver for a recheck if you develop any of the symptoms described above. If medications (antibiotics) were prescribed, take them as directed. Document Released: 08/18/2004 Document Revised: 04/24/2011 Document Reviewed: 04/15/2007 [facility] Patient Information 2015 [place], [place]. This information is not intended to replace advice given to you by your health care provider. Make sure you discuss any questions you have with your health care provider.  Abscess An abscess is an infected area that contains a collection of pus and debris.It can occur in almost any part of the body. An abscess is also known as a furuncle or boil. CAUSES  An abscess occurs when tissue gets infected. This can occur from blockage of oil or sweat glands, infection of hair follicles, or a minor injury to the skin. As the body tries to fight the infection, pus collects in the area and creates pressure under the skin. This pressure causes pain. People with weakened immune systems have difficulty fighting infections and get certain abscesses more often.  SYMPTOMS Usually an abscess develops on the skin and becomes a painful mass that is red, warm, and tender. If the abscess forms under the skin, you may feel a moveable soft area under the skin. Some abscesses break open (rupture) on their own, but most will continue to get worse without care. The infection can spread deeper into the body and eventually into the bloodstream, causing you to feel ill.  DIAGNOSIS  Your  caregiver will take your medical history and perform a physical exam. A sample of fluid may also be taken from the abscess to determine what is causing your infection. TREATMENT  Your caregiver may prescribe antibiotic medicines to fight the infection. However, taking antibiotics alone usually does not cure an abscess. Your caregiver may need to make a small cut  (incision) in the abscess to drain the pus. In some cases, gauze is packed into the abscess to reduce pain and to continue draining the area. HOME CARE INSTRUCTIONS   Only take over-the-counter or prescription medicines for pain, discomfort, or fever as directed by your caregiver.  If you were prescribed antibiotics, take them as directed. Finish them even if you start to feel better.  If gauze is used, follow your caregiver's directions for changing the gauze.  To avoid spreading the infection:  Keep your draining abscess covered with a bandage.  Wash your hands well.  Do not share personal care items, towels, or whirlpools with others.  Avoid skin contact with others.  Keep your skin and clothes clean around the abscess.  Keep all follow-up appointments as directed by your caregiver. SEEK MEDICAL CARE IF:   You have increased pain, swelling, redness, fluid drainage, or bleeding.  You have muscle aches, chills, or a general ill feeling.  You have a fever. MAKE SURE YOU:   Understand these instructions.  Will watch your condition.  Will get help right away if you are not doing well or get worse. Document Released: 11/09/2004 Document Revised: 08/01/2011 Document Reviewed: 04/14/2011 [facility] Patient Information 2015 [place], [place]. This information is not intended to replace advice given to you by your health care provider. Make sure you discuss any questions you have with your health care provider.  Cellulitis Cellulitis is an infection of the skin and the tissue beneath it. The infected area is usually red and tender. Cellulitis occurs most often in the arms and lower legs.  CAUSES  Cellulitis is caused by bacteria that enter the skin through cracks or cuts in the skin. The most common types of bacteria that cause cellulitis are staphylococci and streptococci. SIGNS AND SYMPTOMS   Redness and warmth.  Swelling.  Tenderness or pain.  Fever. DIAGNOSIS  Your  health care provider can usually determine what is wrong based on a physical exam. Blood tests may also be done. TREATMENT  Treatment usually involves taking an antibiotic medicine. HOME CARE INSTRUCTIONS   Take your antibiotic medicine as directed by your health care provider. Finish the antibiotic even if you start to feel better.  Keep the infected arm or leg elevated to reduce swelling.  Apply a warm cloth to the affected area up to 4 times per day to relieve pain.  Take medicines only as directed by your health care provider.  Keep all follow-up visits as directed by your health care provider. SEEK MEDICAL CARE IF:   You notice red streaks coming from the infected area.  Your red area gets larger or turns dark in color.  Your bone or joint underneath the infected area becomes painful after the skin has healed.  Your infection returns in the same area or another area.  You notice a swollen bump in the infected area.  You develop new symptoms.  You have a fever. SEEK IMMEDIATE MEDICAL CARE IF:   You feel very sleepy.  You develop vomiting or diarrhea.  You have a general ill feeling (malaise) with muscle aches and pains. MAKE SURE YOU:  Understand these instructions.  Will watch your condition.  Will get help right away if you are not doing well or get worse. Document Released: 11/09/2004 Document Revised: 06/16/2013 Document Reviewed: 04/17/2011 [facility] Patient Information 2015 [place], [place]. This information is not intended to replace advice given to you by your health care provider. Make sure you discuss any questions you have with your health care provider.

## 2014-06-15 ENCOUNTER — Other Ambulatory Visit: Payer: Self-pay | Admitting: Orthopaedic Surgery

## 2014-06-15 DIAGNOSIS — M25561 Pain in right knee: Secondary | ICD-10-CM

## 2014-06-15 IMAGING — CR DG CHEST 2V
2 series · 2 of 2 positions shown · non-contrast
Comparison: 06/08/2012

CLINICAL DATA: Cough and wheezing.  Chest pain.

CHEST - 2 VIEW

[w chest pa]
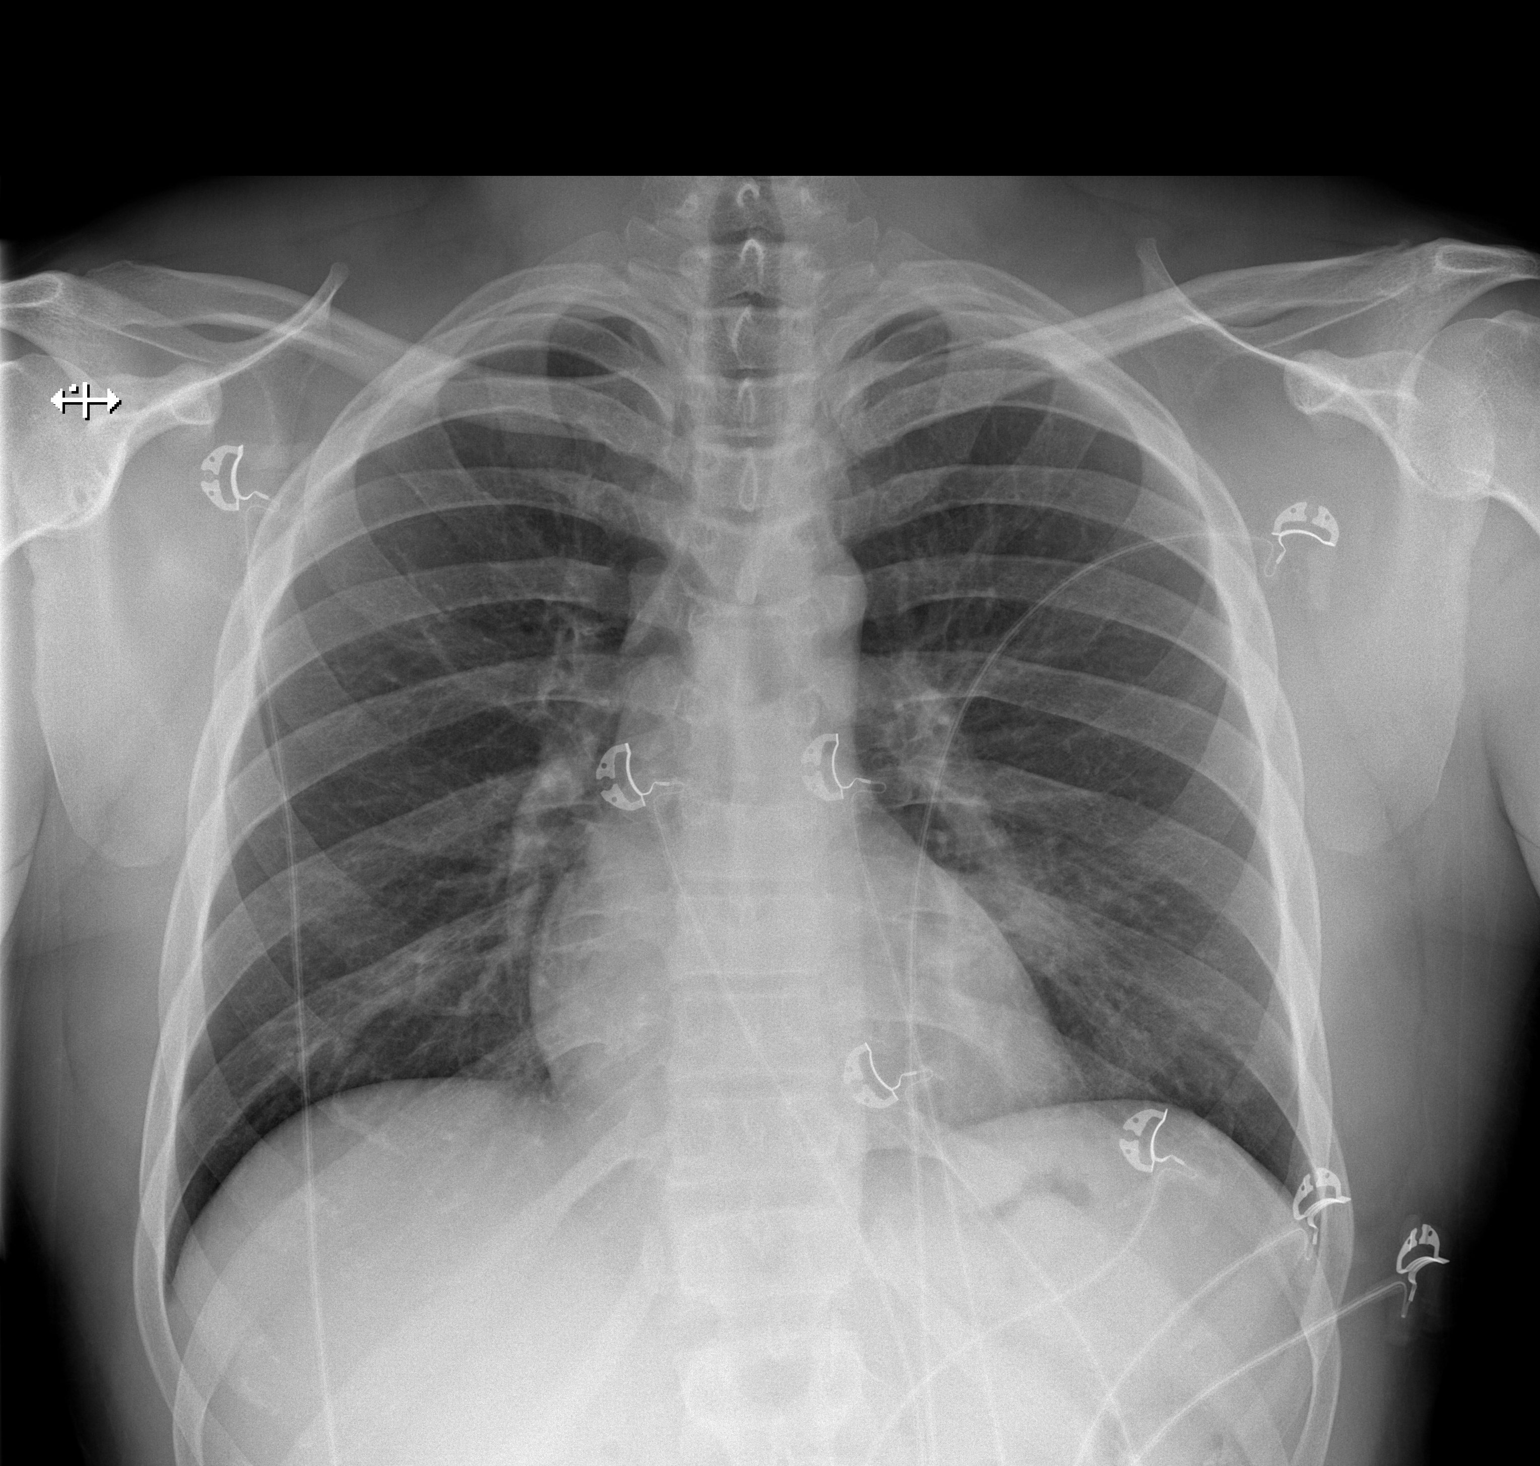

[w chest lat]
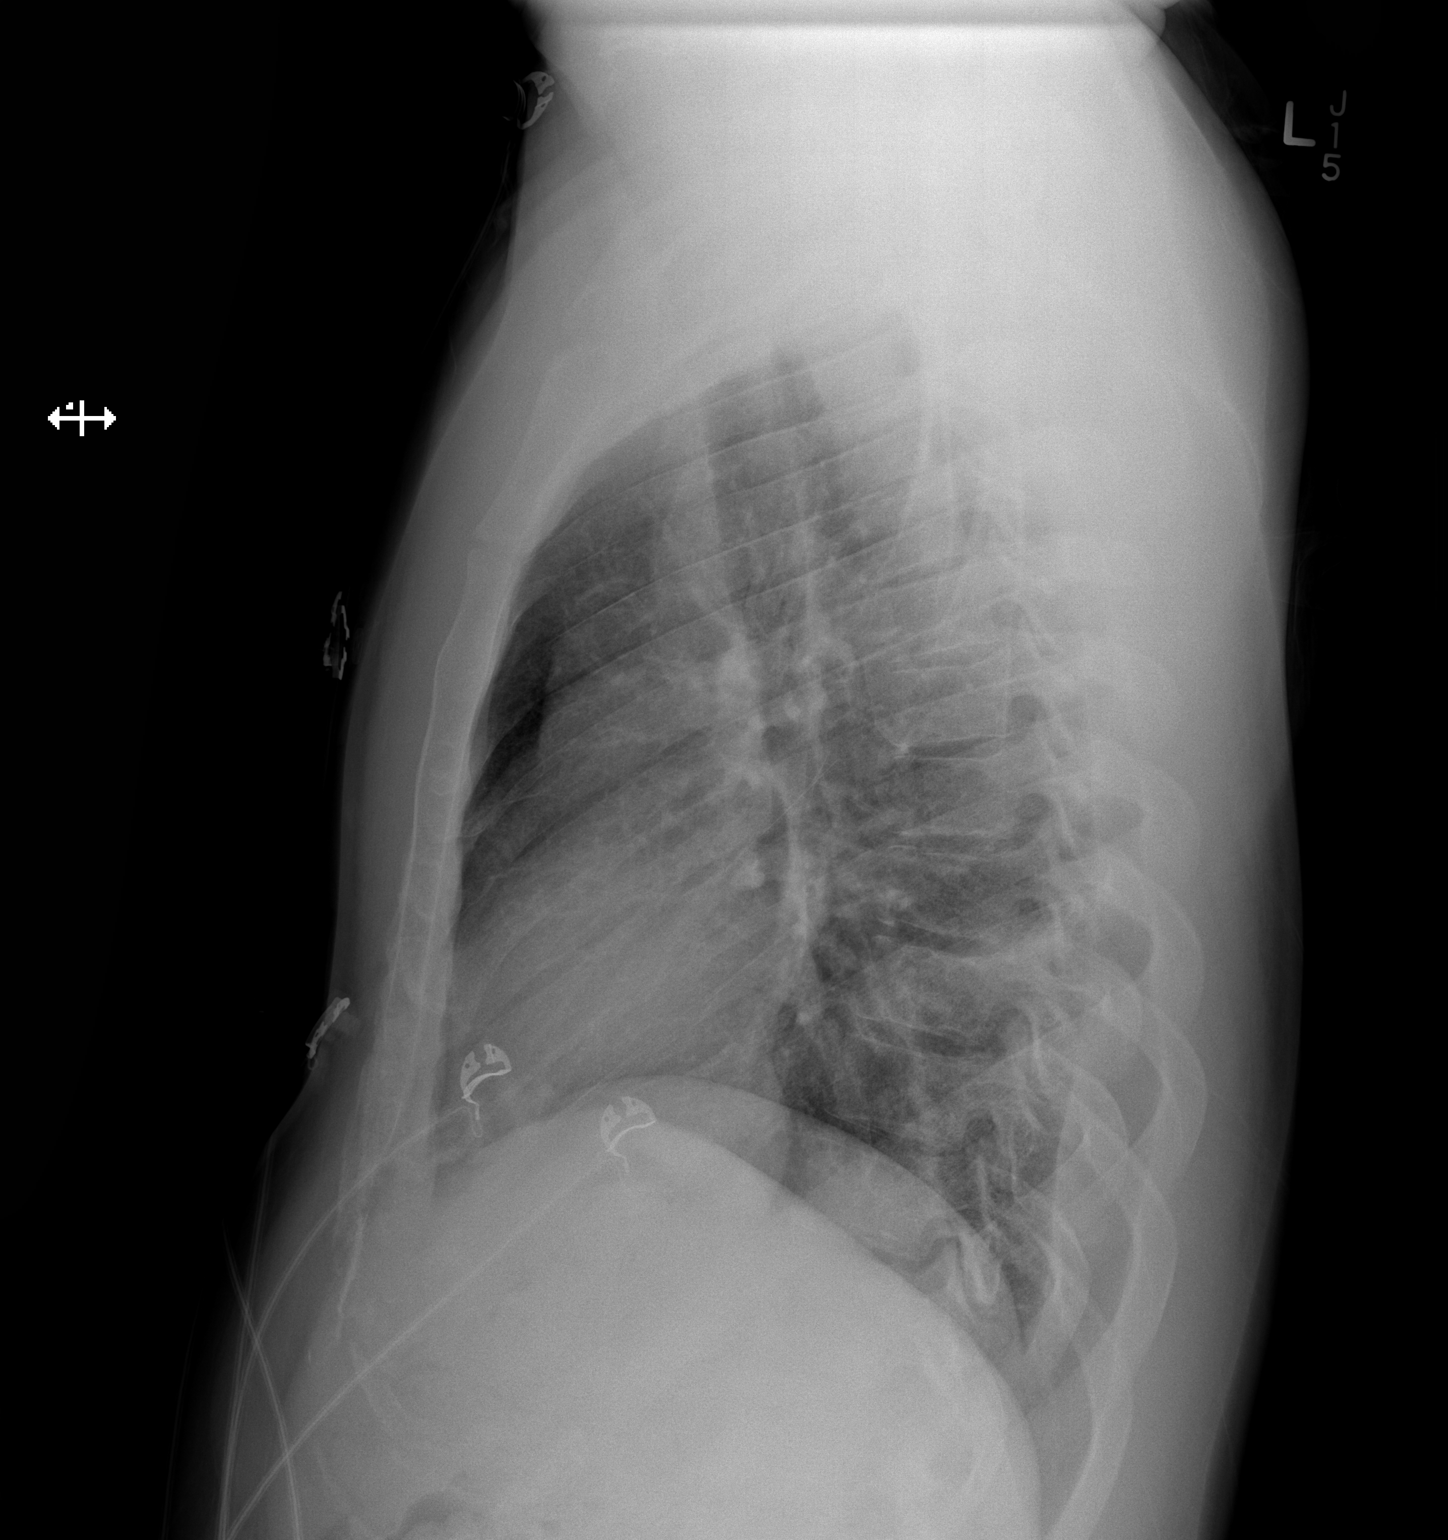

[2 of 2 positions shown; findings below may reference images not displayed]

FINDINGS: Two-view chest shows no edema.  Right lung is clear.  The
airspace disease at the left base seen previously has improved but
not completely resolved. Imaged bony structures of the thorax are
intact. Telemetry leads overlie the chest.
IMPRESSION: Interval improvement without resolution of airspace disease
involving the left base.

## 2014-06-15 IMAGING — CT CT ANGIO CHEST
1 of 2 series · 19 of 32 positions shown · IV contrast (OMNIPAQUE 300)
Comparison: 06/12/2012.

CLINICAL DATA: Pulmonary embolism.  Chest pain.

CT ANGIOGRAPHY CHEST
TECHNIQUE: Multidetector CT imaging of the chest using the
standard protocol during bolus administration of intravenous
contrast. Multiplanar reconstructed images including MIPs were
obtained and reviewed to evaluate the vascular anatomy.
Contrast: 100mL OMNIPAQUE IOHEXOL 350 MG/ML SOLN

[Series 7: thins for pacs · axial · 0.73mm/px · z∈[-200,+38]mm · 19 of 267 slices shown]
[im 14/267  lung]
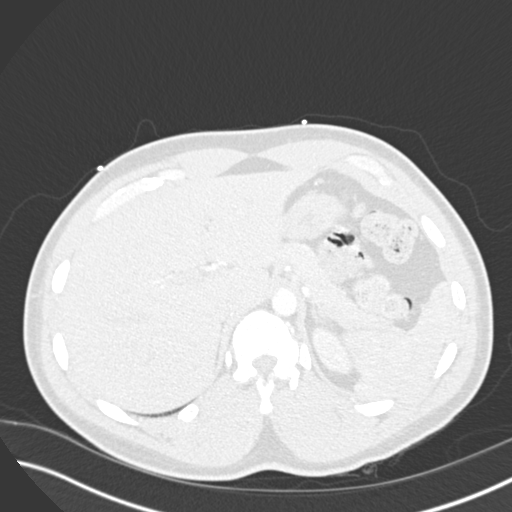
[im 27/267  mediastinal]
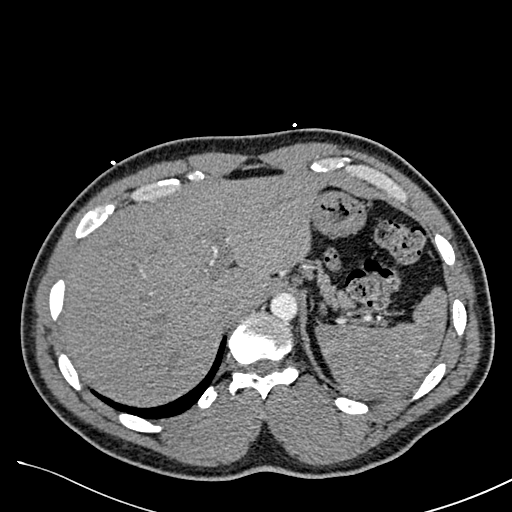
[im 40/267  lung]
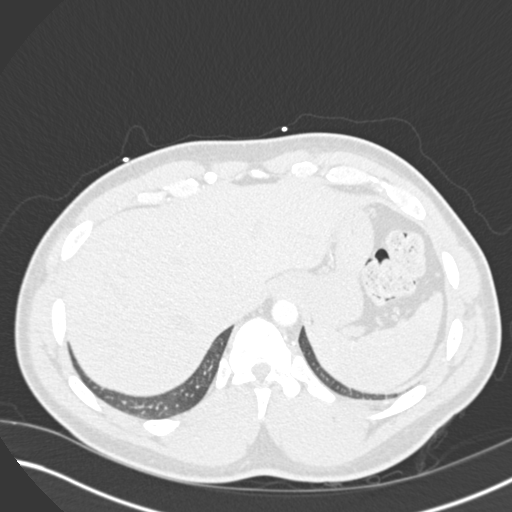
[im 67/267  mediastinal]
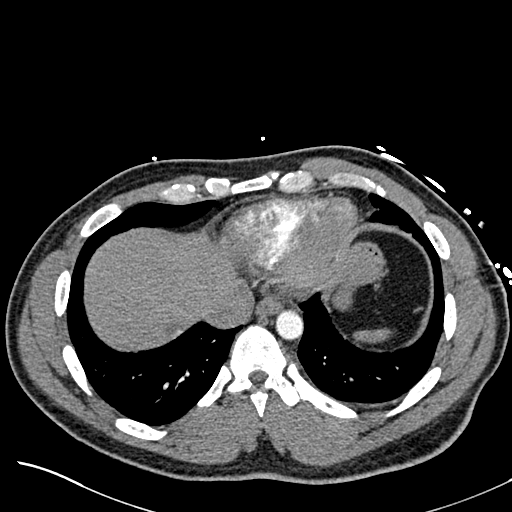
[im 80/267  lung]
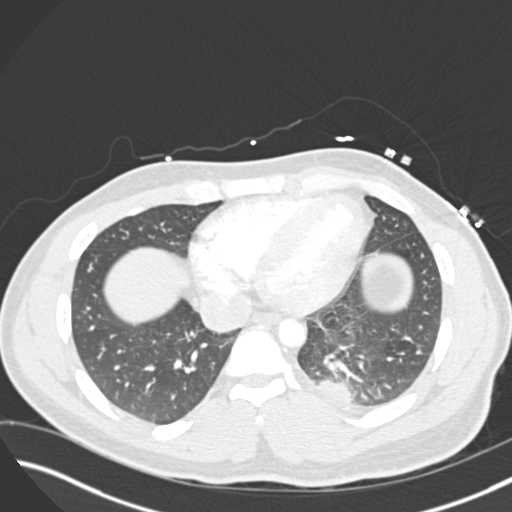
[im 89/267  mediastinal]
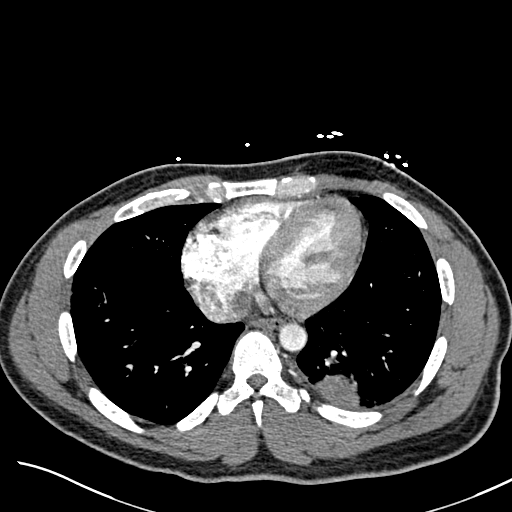
[im 94/267  lung]
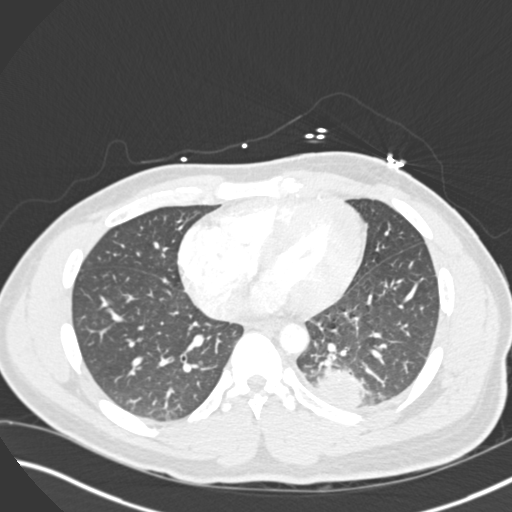
[im 107/267  mediastinal]
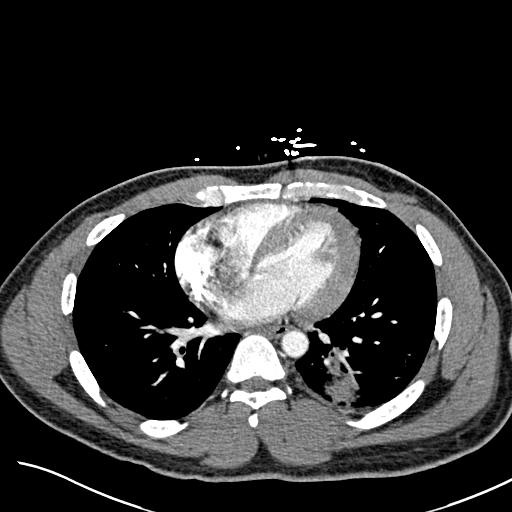
[im 120/267  lung]
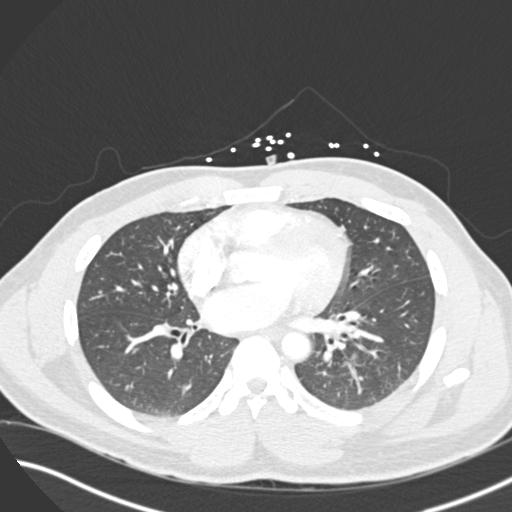
[im 134/267  mediastinal]
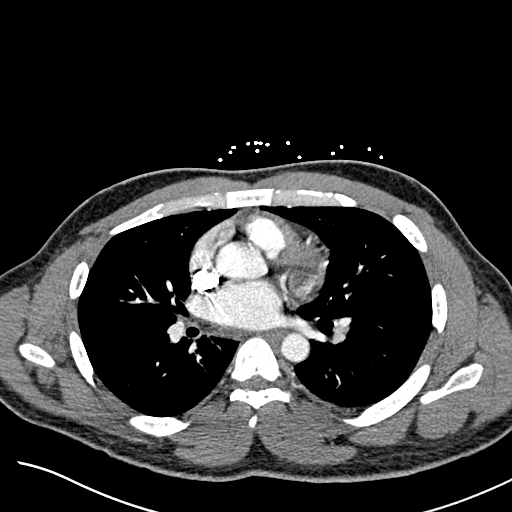
[im 147/267  lung]
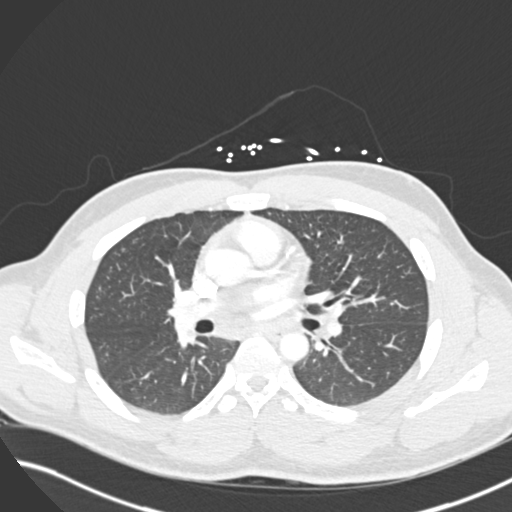
[im 160/267  mediastinal]
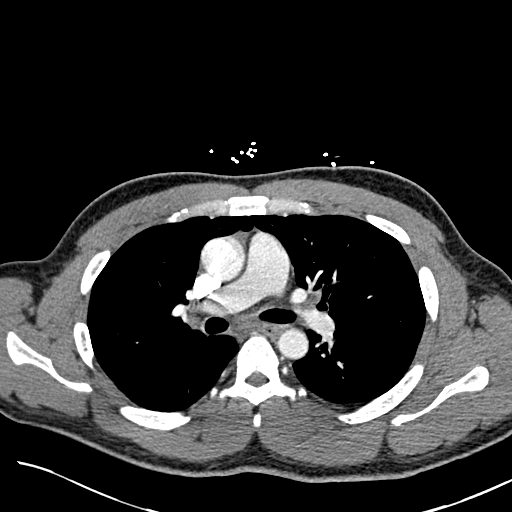
[im 173/267  lung]
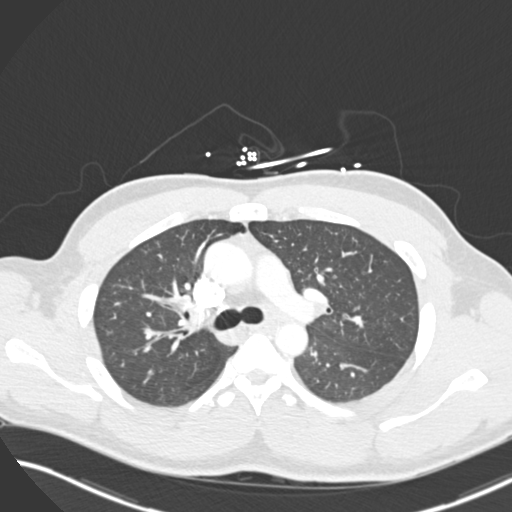
[im 178/267  mediastinal]
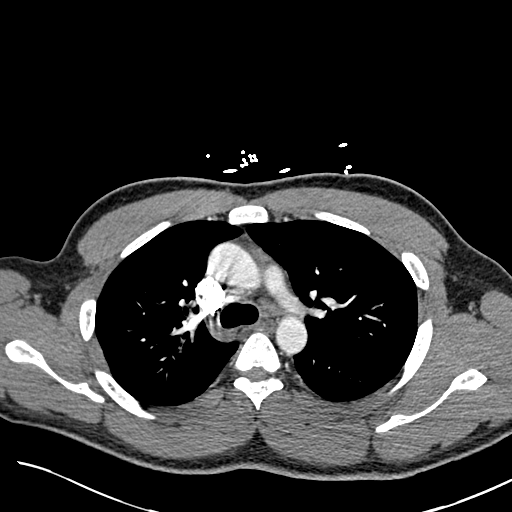
[im 187/267  lung]
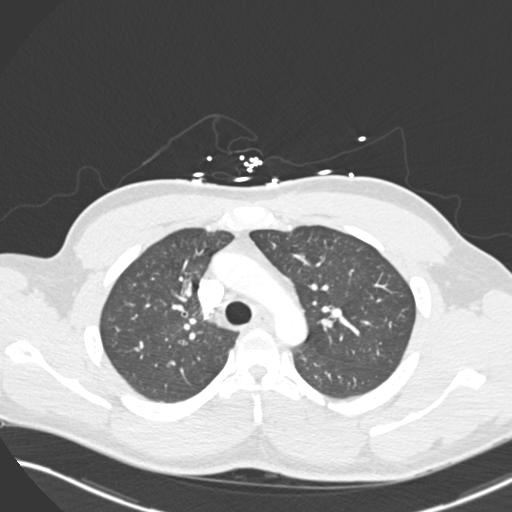
[im 200/267  mediastinal]
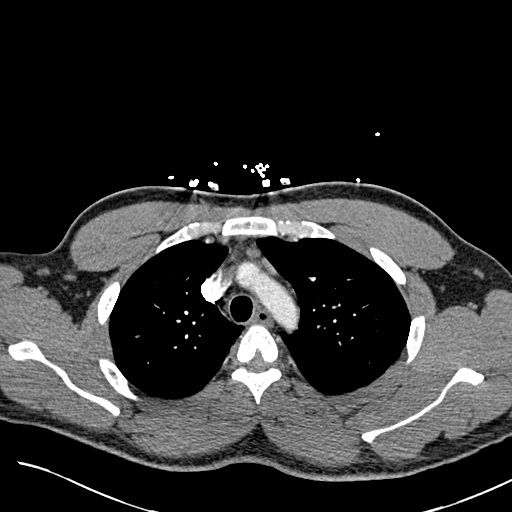
[im 227/267  lung]
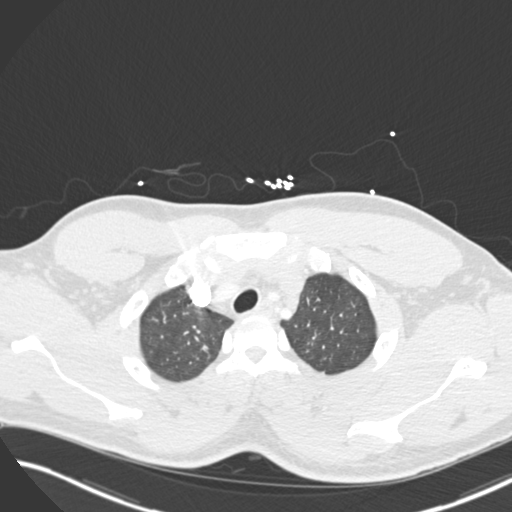
[im 240/267  mediastinal]
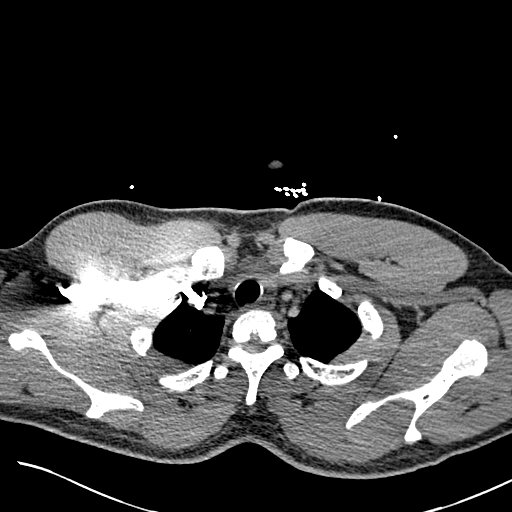
[im 253/267  lung]
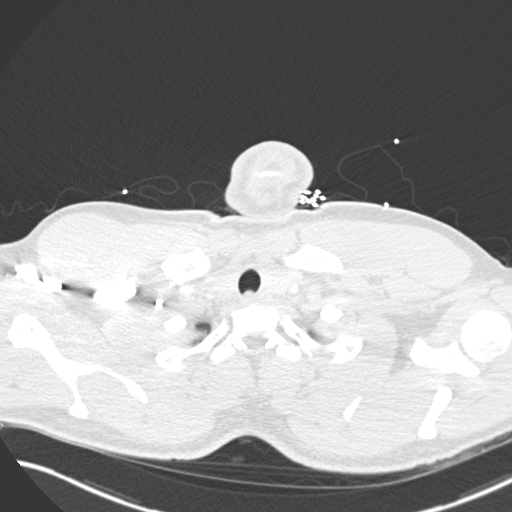

[19 of 32 positions shown; findings below may reference images not displayed]

FINDINGS: Technically adequate study without pulmonary embolism.
The heart appears normal.  No mediastinal or hilar adenopathy.  No
pericardial effusion. Tiny dependently layering left pleural
effusion.

There is a mass in the left lower lobe abutting the posterior
pleural surface measuring 32 mm (image number 58 series 5).
There are no aggressive osseous lesions identified.
IMPRESSION: 1.  Negative for pulmonary embolus or acute aortic abnormality.
2.  Left lower lobe mass measuring 32 mm.  This may represent
bronchogenic neoplasm or infection/inflammatory lesion.
3.  Tiny dependently layering left pleural effusion may represent
parapneumonic effusion associated with pneumonia.  Malignant
effusion considered less likely but difficult to exclude.

## 2014-06-25 ENCOUNTER — Ambulatory Visit
Admission: RE | Admit: 2014-06-25 | Discharge: 2014-06-25 | Disposition: A | Discharge status: Home / Self Care | Payer: BLUE CROSS/BLUE SHIELD | Source: Ambulatory Visit | Attending: Orthopaedic Surgery | Admitting: Orthopaedic Surgery

## 2014-06-25 DIAGNOSIS — M25561 Pain in right knee: Secondary | ICD-10-CM

## 2015-07-12 ENCOUNTER — Encounter (HOSPITAL_COMMUNITY): Payer: Self-pay

## 2015-07-12 ENCOUNTER — Emergency Department (HOSPITAL_COMMUNITY): Payer: No Typology Code available for payment source

## 2015-07-12 ENCOUNTER — Emergency Department (HOSPITAL_COMMUNITY)
Admission: EM | Admit: 2015-07-12 | Discharge: 2015-07-12 | Disposition: A | Discharge status: Home / Self Care | Payer: No Typology Code available for payment source | Attending: Emergency Medicine | Admitting: Emergency Medicine

## 2015-07-12 DIAGNOSIS — M545 Low back pain: Secondary | ICD-10-CM | POA: Diagnosis not present

## 2015-07-12 DIAGNOSIS — J45909 Unspecified asthma, uncomplicated: Secondary | ICD-10-CM | POA: Diagnosis not present

## 2015-07-12 DIAGNOSIS — Y999 Unspecified external cause status: Secondary | ICD-10-CM | POA: Insufficient documentation

## 2015-07-12 DIAGNOSIS — Z792 Long term (current) use of antibiotics: Secondary | ICD-10-CM | POA: Diagnosis not present

## 2015-07-12 DIAGNOSIS — M542 Cervicalgia: Secondary | ICD-10-CM | POA: Insufficient documentation

## 2015-07-12 DIAGNOSIS — M25552 Pain in left hip: Secondary | ICD-10-CM | POA: Diagnosis not present

## 2015-07-12 DIAGNOSIS — Y939 Activity, unspecified: Secondary | ICD-10-CM | POA: Insufficient documentation

## 2015-07-12 DIAGNOSIS — Y9289 Other specified places as the place of occurrence of the external cause: Secondary | ICD-10-CM | POA: Diagnosis not present

## 2015-07-12 DIAGNOSIS — F17210 Nicotine dependence, cigarettes, uncomplicated: Secondary | ICD-10-CM | POA: Insufficient documentation

## 2015-07-12 MED ORDER — NAPROXEN 500 MG PO TABS
500.0000 mg | ORAL_TABLET | Freq: Once | ORAL | Status: AC
  Administered 2015-07-12: 500 mg via ORAL
  Filled 2015-07-12: qty 1

## 2015-07-12 MED ORDER — DIAZEPAM 5 MG PO TABS: 5.0000 mg | ORAL_TABLET | Freq: Two times a day (BID) | ORAL | Status: DC

## 2015-07-12 MED ORDER — DIAZEPAM 5 MG PO TABS
5.0000 mg | ORAL_TABLET | Freq: Once | ORAL | Status: AC
  Administered 2015-07-12: 5 mg via ORAL
  Filled 2015-07-12: qty 1

## 2015-07-12 MED ORDER — NAPROXEN 500 MG PO TABS: 500.0000 mg | ORAL_TABLET | Freq: Two times a day (BID) | ORAL | Status: DC
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
## 2016-04-06 ENCOUNTER — Ambulatory Visit
Admission: RE | Admit: 2016-04-06 | Discharge: 2016-04-06 | Disposition: A | Discharge status: Home / Self Care | Payer: 59 | Source: Ambulatory Visit | Attending: Family Medicine | Admitting: Family Medicine

## 2016-04-06 ENCOUNTER — Other Ambulatory Visit: Payer: Self-pay | Admitting: Family Medicine

## 2016-04-06 DIAGNOSIS — J189 Pneumonia, unspecified organism: Secondary | ICD-10-CM

## 2017-01-29 ENCOUNTER — Emergency Department (HOSPITAL_COMMUNITY)
Admission: EM | Admit: 2017-01-29 | Discharge: 2017-01-29 | Disposition: A | Discharge status: Home / Self Care | Payer: 59 | Attending: Emergency Medicine | Admitting: Emergency Medicine

## 2017-01-29 ENCOUNTER — Encounter (HOSPITAL_COMMUNITY): Payer: Self-pay | Admitting: Emergency Medicine

## 2017-01-29 ENCOUNTER — Other Ambulatory Visit: Payer: Self-pay

## 2017-01-29 DIAGNOSIS — M5431 Sciatica, right side: Secondary | ICD-10-CM | POA: Diagnosis not present

## 2017-01-29 DIAGNOSIS — M545 Low back pain: Secondary | ICD-10-CM | POA: Diagnosis present

## 2017-01-29 DIAGNOSIS — F17210 Nicotine dependence, cigarettes, uncomplicated: Secondary | ICD-10-CM | POA: Diagnosis not present

## 2017-01-29 MED ORDER — CYCLOBENZAPRINE HCL 10 MG PO TABS: 10.0000 mg | ORAL_TABLET | Freq: Two times a day (BID) | ORAL | 0 refills | Status: DC | PRN

## 2017-01-29 MED ORDER — KETOROLAC TROMETHAMINE 60 MG/2ML IM SOLN
30.0000 mg | Freq: Once | INTRAMUSCULAR | Status: AC
  Administered 2017-01-29: 30 mg via INTRAMUSCULAR
  Filled 2017-01-29: qty 2

## 2017-01-29 MED ORDER — PREDNISONE 50 MG PO TABS: ORAL_TABLET | ORAL | 0 refills | Status: DC

## 2017-01-29 NOTE — Discharge Instructions (Signed)
Do not take the muscle relaxer if driving as it will make you sleepy. Follow up with your doctor. Return here for worsening symptoms.  °

## 2017-01-29 NOTE — ED Triage Notes (Signed)
Pt complaint of ongoing right low back pain radiating down right leg for a week; hx of same.

## 2017-01-29 NOTE — ED Notes (Addendum)
Pt reports that " I think I have a pinched nerve" Pt reports that he has been having lower back pain that shoots down to his left leg 10/10. Pt reports that symptoms come and go but have been present x 1 week constant. Pt report pain with movement. Pt denies any recent injuries.

## 2017-01-29 NOTE — ED Provider Notes (Signed)
[facility]-EMERGENCY DEPT Provider Note   CSN: [id number] Arrival date & time: 01/29/17  1101     History   Chief Complaint Chief Complaint  Patient presents with  . Back Pain    HPI Alba DestineChristopher B Peter is a 41 y.o. male who presents to the ED with low back pain that radiates down the right leg. Symptoms started a week ago. Patient reports having similar problem in the past. Last took medication for pain 3 days ago.  The history is provided by the patient. No language interpreter was used.  Back Pain   This is a new problem. The current episode started more than 1 week ago. The problem occurs constantly. The problem has been gradually worsening. The pain is associated with no known injury. The pain is present in the lumbar spine. The quality of the pain is described as shooting, stabbing and aching. The pain radiates to the right thigh. The pain is at a severity of 10/10. The symptoms are aggravated by bending, twisting and certain positions. Associated symptoms include leg pain and tingling. Pertinent negatives include no chest pain, no fever, no weight loss, no headaches, no abdominal pain, no bowel incontinence, no bladder incontinence, no dysuria, no paresthesias and no weakness. He has tried NSAIDs for the symptoms. The treatment provided no relief.    Past Medical History:  Diagnosis Date  . Allergy   . Asthma     There are no active problems to display for this patient.   Past Surgical History:  Procedure Laterality Date  . KNEE SURGERY    . SHOULDER SURGERY         Home Medications    Prior to Admission medications   Medication Sig Start Date End Date Taking? Authorizing Provider  albuterol (PROAIR HFA) 108 (90 Base) MCG/ACT inhaler Inhale 2 puffs into the lungs daily as needed for wheezing or shortness of breath.   Yes [provider]  ibuprofen (ADVIL,MOTRIN) 200 MG tablet Take 800 mg by mouth daily as needed for moderate pain.    Yes [provider]  meloxicam (MOBIC) 7.5 MG tablet Take 15 mg by mouth daily as needed for pain.   Yes [provider]  montelukast (SINGULAIR) 10 MG tablet Take 10 mg by mouth at bedtime.   Yes [provider]  Multiple Vitamin (ONE-A-DAY MENS PO) Take 1 tablet by mouth daily.   Yes [provider]  traMADol (ULTRAM) 50 MG tablet Take 50 mg by mouth daily as needed for moderate pain.   Yes [provider]  cyclobenzaprine (FLEXERIL) 10 MG tablet Take 1 tablet (10 mg total) by mouth 2 (two) times daily as needed for muscle spasms. 01/29/17   Janne NapoleonNeese, Hope M, NP  fluticasone (FLOVENT HFA) 44 MCG/ACT inhaler Inhale 2 puffs into the lungs 2 (two) times daily. Patient not taking: Reported on 01/29/2017 10/30/13   Emi BelfastGessner, Deborah B, FNP  predniSONE (DELTASONE) 50 MG tablet Take one tablet PO daily 01/29/17   Janne NapoleonNeese, Hope M, NP    Family History Family History  Problem Relation Age of Onset  . Cancer Mother   . High Cholesterol Father   . High Cholesterol Brother     Social History Social History   Tobacco Use  . Smoking status: Current Every Day Smoker    Packs/day: 0.50    Types: Cigarettes  . Smokeless tobacco: Never Used  Substance Use Topics  . Alcohol use: No  . Drug use: No  Allergies   Penicillins   Review of Systems Review of Systems  Constitutional: Negative for chills, fever and weight loss.  HENT: Negative.   Eyes: Negative for visual disturbance.  Respiratory: Negative for cough and shortness of breath.   Cardiovascular: Negative for chest pain.  Gastrointestinal: Negative for abdominal pain, bowel incontinence, nausea and vomiting.  Genitourinary: Negative for bladder incontinence and dysuria.  Musculoskeletal: Positive for back pain.  Skin: Negative for wound.  Neurological: Positive for tingling. Negative for weakness, headaches and paresthesias.  Psychiatric/Behavioral: Negative for confusion.     Physical  Exam Updated Vital Signs BP 134/89 (BP Location: Left Arm)   Pulse 83   Temp 98.5 F (36.9 C) (Oral)   Resp 16   Ht 5\' 8"  (1.727 m)   Wt 93 kg (205 lb)   SpO2 96%   BMI 31.17 kg/m   Physical Exam  Constitutional: He is oriented to person, place, and time. He appears well-developed and well-nourished. No distress.  HENT:  Head: Normocephalic and atraumatic.  Eyes: EOM are normal. Pupils are equal, round, and reactive to light.  Neck: Normal range of motion. Neck supple.  Cardiovascular: Normal rate and regular rhythm.  Pulmonary/Chest: Effort normal. No respiratory distress. He has no wheezes. He has no rales.  Abdominal: Soft. Bowel sounds are normal. There is no tenderness.  Musculoskeletal: He exhibits no edema.       Lumbar back: He exhibits tenderness and spasm. He exhibits normal range of motion, no deformity and normal pulse.       Back:  Neurological: He is alert and oriented to person, place, and time. He has normal strength. No cranial nerve deficit or sensory deficit. Coordination and gait normal.  Reflex Scores:      Bicep reflexes are 2+ on the right side and 2+ on the left side.      Brachioradialis reflexes are 2+ on the right side and 2+ on the left side.      Patellar reflexes are 2+ on the right side and 2+ on the left side.      Achilles reflexes are 2+ on the right side and 2+ on the left side. Skin: Skin is warm and dry.  Psychiatric: He has a normal mood and affect. His behavior is normal.  Nursing note and vitals reviewed.    ED Treatments / Results  Labs (all labs ordered are listed, but only abnormal results are displayed) Labs Reviewed - No data to display  Radiology No results found.  Procedures Procedures (including critical care time)  Medications Ordered in ED Medications  ketorolac (TORADOL) injection 30 mg (not administered)     Initial Impression / Assessment and Plan / ED Course  I have reviewed the triage vital signs and the  nursing notes.  Patient with back pain.  No neurological deficits and normal neuro exam.  Patient can walk but states is painful.  No loss of bowel or bladder control.  No concern for cauda equina.  No fever, night sweats, weight loss, h/o cancer, IVDU.  RICE protocol and pain medicine indicated and discussed with patient.  Final Clinical Impressions(s) / ED Diagnoses   Final diagnoses:  Sciatica, right side    ED Discharge Orders        Ordered    predniSONE (DELTASONE) 50 MG tablet     01/29/17 1426    cyclobenzaprine (FLEXERIL) 10 MG tablet  2 times daily PRN     01/29/17 1426  Kerrie Buffaloeese, Hope Dar[place]M, [place]NP 01/29/17 1429    Donnetta Hutchingook, Brian, MD 01/30/17 [phone]

## 2019-03-24 ENCOUNTER — Encounter: Payer: Self-pay | Admitting: Gastroenterology

## 2019-04-29 ENCOUNTER — Telehealth: Payer: Self-pay | Admitting: *Deleted
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
## 2019-04-29 NOTE — Telephone Encounter (Signed)
Pt did not call to RS PV by 5 pm- PV and Colon canceled - NS letter mailed - Hilda Lias PV

## 2019-05-09 ENCOUNTER — Encounter: Payer: 59 | Admitting: Gastroenterology

## 2019-12-17 ENCOUNTER — Other Ambulatory Visit: Payer: Self-pay

## 2019-12-17 ENCOUNTER — Emergency Department (HOSPITAL_BASED_OUTPATIENT_CLINIC_OR_DEPARTMENT_OTHER)
Admission: EM | Admit: 2019-12-17 | Discharge: 2019-12-17 | Disposition: A | Discharge status: Home / Self Care | Payer: 59 | Attending: Emergency Medicine | Admitting: Emergency Medicine

## 2019-12-17 ENCOUNTER — Emergency Department (HOSPITAL_BASED_OUTPATIENT_CLINIC_OR_DEPARTMENT_OTHER): Payer: 59

## 2019-12-17 ENCOUNTER — Encounter (HOSPITAL_BASED_OUTPATIENT_CLINIC_OR_DEPARTMENT_OTHER): Payer: Self-pay

## 2019-12-17 DIAGNOSIS — J45909 Unspecified asthma, uncomplicated: Secondary | ICD-10-CM | POA: Insufficient documentation

## 2019-12-17 DIAGNOSIS — R0789 Other chest pain: Secondary | ICD-10-CM | POA: Insufficient documentation

## 2019-12-17 DIAGNOSIS — I10 Essential (primary) hypertension: Secondary | ICD-10-CM

## 2019-12-17 DIAGNOSIS — F17210 Nicotine dependence, cigarettes, uncomplicated: Secondary | ICD-10-CM | POA: Diagnosis not present

## 2019-12-17 DIAGNOSIS — R079 Chest pain, unspecified: Secondary | ICD-10-CM

## 2019-12-17 LAB — BASIC METABOLIC PANEL
Anion gap: 10 (ref 5–15)
BUN: 14 mg/dL (ref 6–20)
CO2: 23 mmol/L (ref 22–32)
Calcium: 9.1 mg/dL (ref 8.9–10.3)
Chloride: 101 mmol/L (ref 98–111)
Creatinine, Ser: 1.15 mg/dL (ref 0.61–1.24)
GFR, Estimated: >60 mL/min (ref >60)
Glucose, Bld: 104 mg/dL — ABNORMAL HIGH (ref 70–99)
Potassium: 3.9 mmol/L (ref 3.5–5.1)
Sodium: 134 mmol/L — ABNORMAL LOW (ref 135–145)

## 2019-12-17 LAB — CBC
HCT: 40.5 % (ref 39.0–52.0)
Hemoglobin: 12.7 g/dL — ABNORMAL LOW (ref 13.0–17.0)
MCH: 23.3 pg — ABNORMAL LOW (ref 26.0–34.0)
MCHC: 31.4 g/dL (ref 30.0–36.0)
MCV: 74.3 fL — ABNORMAL LOW (ref 80.0–100.0)
Platelets: 266 10*3/uL (ref 150–400)
RBC: 5.45 MIL/uL (ref 4.22–5.81)
RDW: 14.6 % (ref 11.5–15.5)
WBC: 6.7 10*3/uL (ref 4.0–10.5)
nRBC: 0 % (ref 0.0–0.2)

## 2019-12-17 LAB — TROPONIN I (HIGH SENSITIVITY)
Troponin I (High Sensitivity): 3 ng/L (ref <18)
Troponin I (High Sensitivity): 3 ng/L (ref <18)

## 2019-12-17 MED ORDER — FAMOTIDINE IN NACL 20-0.9 MG/50ML-% IV SOLN
20.0000 mg | Freq: Once | INTRAVENOUS | Status: AC
  Administered 2019-12-17: 20 mg via INTRAVENOUS
  Filled 2019-12-17: qty 50

## 2019-12-17 MED ORDER — SODIUM CHLORIDE 0.9 % IV SOLN: INTRAVENOUS | Status: DC | PRN

## 2019-12-17 MED ORDER — LIDOCAINE VISCOUS HCL 2 % MT SOLN
15.0000 mL | Freq: Once | OROMUCOSAL | Status: AC
  Administered 2019-12-17: 15 mL via ORAL
  Filled 2019-12-17: qty 15

## 2019-12-17 MED ORDER — ASPIRIN 81 MG PO CHEW
324.0000 mg | CHEWABLE_TABLET | Freq: Once | ORAL | Status: AC
  Administered 2019-12-17: 243 mg via ORAL
  Filled 2019-12-17: qty 4

## 2019-12-17 MED ORDER — ALUM & MAG HYDROXIDE-SIMETH 200-200-20 MG/5ML PO SUSP
30.0000 mL | Freq: Once | ORAL | Status: AC
  Administered 2019-12-17: 30 mL via ORAL
  Filled 2019-12-17: qty 30

## 2019-12-17 MED ORDER — ACETAMINOPHEN 325 MG PO TABS
650.0000 mg | ORAL_TABLET | Freq: Once | ORAL | Status: AC
  Administered 2019-12-17: 650 mg via ORAL
  Filled 2019-12-17: qty 2

## 2019-12-17 MED ORDER — OMEPRAZOLE 20 MG PO CPDR: 20.0000 mg | DELAYED_RELEASE_CAPSULE | Freq: Every day | ORAL | 0 refills | Status: AC

## 2019-12-17 NOTE — ED Triage Notes (Signed)
Pt arrives complaining of midsternal chest pain starting an hour ago. Denies sob, dizziness.

## 2019-12-17 NOTE — Discharge Instructions (Addendum)
Take the medications as prescribed.  Follow-up with your doctor next week to be rechecked.  Your blood pressure was a little bit elevated.  return to the ED for evaluation of worsening symptoms

## 2019-12-17 NOTE — ED Notes (Signed)
Review D/C papers with pt, reviewed Rx with pt, pt states understanding, pt denies questions at this time. 

## 2019-12-17 NOTE — ED Provider Notes (Signed)
[facility] EMERGENCY DEPARTMENT Provider Note   CSN: [id number] Arrival date & time: 12/17/19  6203     History Chief Complaint  Patient presents with  . Chest Pain    Tanner Holden is a 44 y.o. male.  HPI  HPI: A 44 year old patient with a history of obesity presents for evaluation of chest pain. Initial onset of pain was approximately 1-3 hours ago. The patient's chest pain is well-localized, is described as heaviness/pressure/tightness and is not worse with exertion. The patient's chest pain is middle- or left-sided, is not sharp and does not radiate to the arms/jaw/neck. The patient does not complain of nausea and denies diaphoresis. The patient has smoked in the past 90 days. The patient has no history of stroke, has no history of peripheral artery disease, denies any history of treated diabetes, has no relevant family history of coronary artery disease (first degree relative at less than age 58), is not hypertensive and has no history of hypercholesterolemia.  Patient states it feels like A KNOT in the mid to right side of his chest.  The pain was more intense initially and it is easing off.  Taking a deep breath at one point did make the pain worse. Past Medical History:  Diagnosis Date  . Allergy   . Asthma     There are no problems to display for this patient.   Past Surgical History:  Procedure Laterality Date  . KNEE SURGERY    . SHOULDER SURGERY         Family History  Problem Relation Age of Onset  . Cancer Mother   . High Cholesterol Father   . High Cholesterol Brother     Social History   Tobacco Use  . Smoking status: Current Every Day Smoker    Packs/day: 0.50    Types: Cigarettes  . Smokeless tobacco: Never Used  Substance Use Topics  . Alcohol use: Yes    Comment: socially  . Drug use: No    Home Medications Prior to Admission medications   Medication Sig Start Date End Date Taking? Authorizing Provider  albuterol  (PROAIR HFA) 108 (90 Base) MCG/ACT inhaler Inhale 2 puffs into the lungs daily as needed for wheezing or shortness of breath.   Yes [provider]  montelukast (SINGULAIR) 10 MG tablet Take 10 mg by mouth at bedtime.   Yes [provider]  Multiple Vitamin (ONE-A-DAY MENS PO) Take 1 tablet by mouth daily.   Yes [provider]  fluticasone (FLOVENT HFA) 44 MCG/ACT inhaler Inhale 2 puffs into the lungs 2 (two) times daily. Patient not taking: Reported on 01/29/2017 10/30/13   Emi Belfast, FNP  ibuprofen (ADVIL,MOTRIN) 200 MG tablet Take 800 mg by mouth daily as needed for moderate pain.    [provider]  omeprazole (PRILOSEC) 20 MG capsule Take 1 capsule (20 mg total) by mouth daily. 12/17/19   Linwood Dibbles, MD  traMADol (ULTRAM) 50 MG tablet Take 50 mg by mouth daily as needed for moderate pain.    [provider]    Allergies    Penicillins  Review of Systems   Review of Systems  All other systems reviewed and are negative.   Physical Exam Updated Vital Signs BP (!) 143/111 (BP Location: Right Arm)   Pulse 72   Temp 98.1 F (36.7 C)   Resp 16   Ht 1.727 m (5\' 8" )   Wt 90.7 kg   SpO2 100%   BMI  30.41 kg/m   Physical Exam Vitals and nursing note reviewed.  Constitutional:      General: He is not in acute distress.    Appearance: He is well-developed.  HENT:     Head: Normocephalic and atraumatic.     Right Ear: External ear normal.     Left Ear: External ear normal.  Eyes:     General: No scleral icterus.       Right eye: No discharge.        Left eye: No discharge.     Conjunctiva/sclera: Conjunctivae normal.  Neck:     Trachea: No tracheal deviation.  Cardiovascular:     Rate and Rhythm: Normal rate and regular rhythm.  Pulmonary:     Effort: Pulmonary effort is normal. No respiratory distress.     Breath sounds: Normal breath sounds. No stridor. No wheezing or rales.  Abdominal:     General: Bowel sounds are  normal. There is no distension.     Palpations: Abdomen is soft.     Tenderness: There is no abdominal tenderness. There is no guarding or rebound.  Musculoskeletal:        General: No tenderness.     Cervical back: Neck supple.  Skin:    General: Skin is warm and dry.     Findings: No rash.  Neurological:     Mental Status: He is alert.     Cranial Nerves: No cranial nerve deficit (no facial droop, extraocular movements intact, no slurred speech).     Sensory: No sensory deficit.     Motor: No abnormal muscle tone or seizure activity.     Coordination: Coordination normal.     ED Results / Procedures / Treatments   Labs (all labs ordered are listed, but only abnormal results are displayed) Labs Reviewed  CBC - Abnormal; Notable for the following components:      Result Value   Hemoglobin 12.7 (*)    MCV 74.3 (*)    MCH 23.3 (*)    All other components within normal limits  BASIC METABOLIC PANEL - Abnormal; Notable for the following components:   Sodium 134 (*)    Glucose, Bld 104 (*)    All other components within normal limits  TROPONIN I (HIGH SENSITIVITY)  TROPONIN I (HIGH SENSITIVITY)    EKG None (not in muse)Normal sinus rate 79 Normal QRS complexes Nonspecific ST changes laterally, and inverted T waves noted inferiorly as well as laterally EKG is similar in appearance to EKG performed on June 12, 2012 Radiology DG Chest 2 View  Result Date: 12/17/2019 CLINICAL DATA:  Chest pain EXAM: CHEST - 2 VIEW COMPARISON:  April 06, 2016 FINDINGS: The cardiomediastinal silhouette is normal in contour. No pleural effusion. No pneumothorax. No acute pleuroparenchymal abnormality. Visualized abdomen is unremarkable. No acute osseous abnormality noted. IMPRESSION: No acute cardiopulmonary abnormality. Electronically Signed   By: Meda Klinefelter MD   On: 12/17/2019 09:55    Procedures Procedures (including critical care time)  Medications Ordered in ED Medications    0.9 %  sodium chloride infusion ( Intravenous New Bag/Given 12/17/19 1355)  aspirin chewable tablet 324 mg (243 mg Oral Given 12/17/19 0952)  alum & mag hydroxide-simeth (MAALOX/MYLANTA) 200-200-20 MG/5ML suspension 30 mL (30 mLs Oral Given 12/17/19 1350)    And  lidocaine (XYLOCAINE) 2 % viscous mouth solution 15 mL (15 mLs Oral Given 12/17/19 1350)  famotidine (PEPCID) IVPB 20 mg premix (20 mg Intravenous New Bag/Given 12/17/19 1357)  acetaminophen (  TYLENOL) tablet 650 mg (650 mg Oral Given 12/17/19 1349)    ED Course  I have reviewed the triage vital signs and the nursing notes.  Pertinent labs & imaging results that were available during my care of the patient were reviewed by me and considered in my medical decision making (see chart for details).  Clinical Course as of Dec 16 1500  Wed Dec 17, 2019  1018 Initial labs show normal troponin.  CBC and metabolic panel reassuring   [JK]  1018 X-ray without acute findings   [JK]  1248 Troponins are normal   [JK]  1333 Patient is having some persistent discomfort.  Will try GI cocktail   [JK]  1335 Reviewed blood pressure with the patient.  He states normally his blood pressure is pretty well controlled.  This may be related to pain.   [JK]  1501 Patient noted some improvement after GI cocktail and antacid   [JK]    Clinical Course User Index [JK] Linwood Dibbles, MD   MDM Rules/Calculators/A&P HEAR Score: 3                        Patient presented to ED with complaints of chest pain.  Patient denies any dyspnea.  No PE risk factors.  PERC negative.  Doubt pulmonary embolism.  Symptoms not suggestive of aortic dissection.  No acute abnormality noted on chest x-ray.  Patient low risk for heart disease.  Serial troponins are normal.  I doubt acute coronary syndrome.  Symptoms may be related to esophageal irritation.  At this point patient's ED work-up is reassuring.  Will discharge home with antacids.  Follow-up with primary care doctor.   Warning signs and precautions discussed.   Final Clinical Impression(s) / ED Diagnoses Final diagnoses:  Chest pain, unspecified type  Hypertension, unspecified type    Rx / DC Orders ED Discharge Orders         Ordered    omeprazole (PRILOSEC) 20 MG capsule  Daily        12/17/19 1500           Linwood Dibbles, MD 12/17/19 1504

## 2024-01-16 ENCOUNTER — Encounter (HOSPITAL_BASED_OUTPATIENT_CLINIC_OR_DEPARTMENT_OTHER): Payer: Self-pay

## 2024-01-16 ENCOUNTER — Emergency Department (HOSPITAL_BASED_OUTPATIENT_CLINIC_OR_DEPARTMENT_OTHER)
Admission: EM | Admit: 2024-01-16 | Discharge: 2024-01-16 | Disposition: A | Discharge status: Home / Self Care | Payer: Self-pay | Attending: Emergency Medicine | Admitting: Emergency Medicine

## 2024-01-16 ENCOUNTER — Other Ambulatory Visit: Payer: Self-pay

## 2024-01-16 ENCOUNTER — Emergency Department (HOSPITAL_BASED_OUTPATIENT_CLINIC_OR_DEPARTMENT_OTHER): Payer: Self-pay

## 2024-01-16 DIAGNOSIS — R079 Chest pain, unspecified: Secondary | ICD-10-CM

## 2024-01-16 DIAGNOSIS — R059 Cough, unspecified: Secondary | ICD-10-CM | POA: Insufficient documentation

## 2024-01-16 DIAGNOSIS — J45909 Unspecified asthma, uncomplicated: Secondary | ICD-10-CM | POA: Insufficient documentation

## 2024-01-16 DIAGNOSIS — R0789 Other chest pain: Secondary | ICD-10-CM | POA: Insufficient documentation

## 2024-01-16 LAB — CBC
HCT: 40.6 % (ref 39.0–52.0)
Hemoglobin: 12.6 g/dL — ABNORMAL LOW (ref 13.0–17.0)
MCH: 22.8 pg — ABNORMAL LOW (ref 26.0–34.0)
MCHC: 31 g/dL (ref 30.0–36.0)
MCV: 73.6 fL — ABNORMAL LOW (ref 80.0–100.0)
Platelets: 274 K/uL (ref 150–400)
RBC: 5.52 MIL/uL (ref 4.22–5.81)
RDW: 14.6 % (ref 11.5–15.5)
WBC: 6.3 K/uL (ref 4.0–10.5)
nRBC: 0 % (ref 0.0–0.2)

## 2024-01-16 LAB — BASIC METABOLIC PANEL WITH GFR
Anion gap: 9 (ref 5–15)
BUN: 14 mg/dL (ref 6–20)
CO2: 26 mmol/L (ref 22–32)
Calcium: 8.9 mg/dL (ref 8.9–10.3)
Chloride: 103 mmol/L (ref 98–111)
Creatinine, Ser: 1.08 mg/dL (ref 0.61–1.24)
GFR, Estimated: >60 mL/min (ref >60)
Glucose, Bld: 120 mg/dL — ABNORMAL HIGH (ref 70–99)
Potassium: 4 mmol/L (ref 3.5–5.1)
Sodium: 138 mmol/L (ref 135–145)

## 2024-01-16 LAB — RESP PANEL BY RT-PCR (RSV, FLU A&B, COVID)  RVPGX2
Influenza A by PCR: NEGATIVE
Influenza B by PCR: NEGATIVE
Resp Syncytial Virus by PCR: NEGATIVE
SARS Coronavirus 2 by RT PCR: NEGATIVE

## 2024-01-16 LAB — TROPONIN T, HIGH SENSITIVITY: Troponin T High Sensitivity: <15 ng/L (ref 0–19)

## 2024-01-16 MED ORDER — PREDNISONE 20 MG PO TABS: 40.0000 mg | ORAL_TABLET | Freq: Every day | ORAL | 0 refills | Status: AC

## 2024-01-16 MED ORDER — IPRATROPIUM-ALBUTEROL 0.5-2.5 (3) MG/3ML IN SOLN
3.0000 mL | Freq: Once | RESPIRATORY_TRACT | Status: AC
  Administered 2024-01-16: 3 mL via RESPIRATORY_TRACT
  Filled 2024-01-16: qty 3

## 2024-01-16 MED ORDER — ALBUTEROL SULFATE HFA 108 (90 BASE) MCG/ACT IN AERS: 2.0000 | INHALATION_SPRAY | Freq: Every day | RESPIRATORY_TRACT | 0 refills | Status: AC | PRN

## 2024-01-16 MED ORDER — METHYLPREDNISOLONE SODIUM SUCC 125 MG IJ SOLR
125.0000 mg | Freq: Once | INTRAMUSCULAR | Status: AC
  Administered 2024-01-16: 125 mg via INTRAVENOUS
  Filled 2024-01-16: qty 2

## 2024-01-16 NOTE — ED Triage Notes (Signed)
 Mid chest pain for 1 week. Intermittent SHOB, cough

## 2024-01-16 NOTE — Discharge Instructions (Signed)
 Your shortness of breath was likely due to your asthma. We gave you some breathing treatments and some steroids. Take the steroids for the next four days and I have given you a refill on your albuterol. The tightness in your chest was not due to your heart, it was likely due to your trouble breathing.  Please see your primary care doctor in the next couple of weeks if possible.

## 2024-01-16 NOTE — ED Notes (Signed)

## 2024-01-16 NOTE — ED Provider Notes (Signed)
 [facility] Provider Note   CSN: [id number] Arrival date & time: 01/16/24  0725     Patient presents with: Chest Pain   HERMES WAFER is a 48 y.o. male with PMH of asthma and obesity presents for evaluation of chest pain has been going on for the last 3 weeks.  He states that about 3 to 4 weeks ago he had a cold and started coughing at that time.  He states that he usually smokes half pack a day but has been decreasing out to about half in the last few days.  Heads persistent cough with productive's sputum which is whitish to yellow less over the last few days.    Chest Pain      Prior to Admission medications   Medication Sig Start Date End Date Taking? Authorizing Provider  predniSONE  (DELTASONE ) 20 MG tablet Take 2 tablets (40 mg total) by mouth daily with breakfast for 4 doses. 01/16/24 01/20/24 Yes D'Mello, Bran Aldridge, DO  albuterol (PROAIR HFA) 108 (90 Base) MCG/ACT inhaler Inhale 2 puffs into the lungs daily as needed for wheezing or shortness of breath. 01/16/24   D'Mello, Luda Charbonneau, DO  fluticasone  (FLOVENT  HFA) 44 MCG/ACT inhaler Inhale 2 puffs into the lungs 2 (two) times daily. Patient not taking: Reported on 01/29/2017 10/30/13   Avram Barnie KATHEE, FNP  ibuprofen (ADVIL,MOTRIN) 200 MG tablet Take 800 mg by mouth daily as needed for moderate pain.    [provider]  montelukast (SINGULAIR) 10 MG tablet Take 10 mg by mouth at bedtime.    [provider]  Multiple Vitamin (ONE-A-DAY MENS PO) Take 1 tablet by mouth daily.    [provider]  omeprazole  (PRILOSEC) 20 MG capsule Take 1 capsule (20 mg total) by mouth daily. 12/17/19   Randol Simmonds, MD  traMADol (ULTRAM) 50 MG tablet Take 50 mg by mouth daily as needed for moderate pain.    [provider]    Allergies: Penicillins    Review of Systems  Cardiovascular:  Positive for chest pain.  As noted in HPI  Updated Vital Signs BP (!)  149/106   Pulse 73   Temp 98.3 F (36.8 C) (Oral)   Resp 15   SpO2 100%   Physical Exam Constitutional:      General: He is not in acute distress.    Appearance: He is not toxic-appearing.  Cardiovascular:     Rate and Rhythm: Normal rate and regular rhythm.     Heart sounds: No murmur heard.    No systolic murmur is present.  Pulmonary:     Effort: No tachypnea, accessory muscle usage or respiratory distress.  Neurological:     Mental Status: He is alert.     (all labs ordered are listed, but only abnormal results are displayed) Labs Reviewed  BASIC METABOLIC PANEL WITH GFR - Abnormal; Notable for the following components:      Result Value   Glucose, Bld 120 (*)    All other components within normal limits  CBC - Abnormal; Notable for the following components:   Hemoglobin 12.6 (*)    MCV 73.6 (*)    MCH 22.8 (*)    All other components within normal limits  RESP PANEL BY RT-PCR (RSV, FLU A&B, COVID)  RVPGX2  TROPONIN T, HIGH SENSITIVITY    EKG: EKG Interpretation Date/Time:  Wednesday January 16 2024 07:39:18 EST Ventricular Rate:  76 PR Interval:  135 QRS Duration:  76  QT Interval:  367 QTC Calculation: 413 R Axis:   60  Text Interpretation: Sinus rhythm Probable left atrial enlargement Borderline T wave abnormalities Minimal ST elevation, anterolateral leads Confirmed by Pamella Sharper [phone]) on 01/16/2024 7:58:20 AM  Radiology: DG Chest 2 View Result Date: 01/16/2024 EXAM: 2 VIEW(S) XRAY OF THE CHEST 01/16/2024 08:04:00 AM COMPARISON: Chest x-ray 12/17/2019` CLINICAL HISTORY: chest pain FINDINGS: LUNGS AND PLEURA: No focal pulmonary opacity. No pleural effusion. No pneumothorax. HEART AND MEDIASTINUM: No acute abnormality of the cardiac and mediastinal silhouettes. BONES AND SOFT TISSUES: No acute osseous abnormality. IMPRESSION: 1. No acute cardiopulmonary process. Electronically signed by: Toribio Agreste MD 01/16/2024 08:12 AM EST RP Workstation: [id number]      Procedures   Medications Ordered in the ED  methylPREDNISolone sodium succinate (SOLU-MEDROL) 125 mg/2 mL injection 125 mg (125 mg Intravenous Given 01/16/24 0825)  ipratropium-albuterol (DUONEB) 0.5-2.5 (3) MG/3ML nebulizer solution 3 mL (3 mLs Nebulization Given 01/16/24 0830)                                    Medical Decision Making Amount and/or Complexity of Data Reviewed Labs: ordered. Radiology: ordered.  Risk Prescription drug management.   Differentials include: Asthma exacerbation, pulmonary embolism, aortic dissection, pneumothorax, COPD exacerbation, pulmonary edema   On initial impression patient send vital signs are stable.  He is not in acute distress and has normal respiratory effort.  No respiratory etiology shortness of breath.  Will also get troponin to check for ACS.  EKG did not show any acute ischemia.  Will also give patient some DuoNebs and steroids for the wheezing that he is having.  Patient stable on room air  Reevaluation states that his breathing is better.  He feels like the tightness in his chest is also resolved slightly.  Respiratory panel remarkable.  First troponin is negative and no acute ischemic changes EEG likely to be ACS.  Likely mild asthma exacerbation.  Will send patient home with 4 more days of prednisone  and refill of his albuterol inhaler.  Will need to follow-up with primary care physician. Patient is agreeable to this plan      Final diagnoses:  None    ED Discharge Orders          Ordered    predniSONE  (DELTASONE ) 20 MG tablet  Daily with breakfast        01/16/24 0844    albuterol (PROAIR HFA) 108 (90 Base) MCG/ACT inhaler  Daily PRN        01/16/24 0845               D'Mello, Katha Kuehne, DO 01/16/24 0902    Pamella Sharper LABOR, DO 01/17/24 1547
